# Patient Record
Sex: Female | Born: 1970 | Race: White | Hispanic: No | Marital: Single | Smoking: Never smoker
Health system: Southern US, Community
[De-identification: ages and names within clinical notes are randomized; demographics above are authoritative.]

## PROBLEM LIST (undated history)

## (undated) DIAGNOSIS — D649 Anemia, unspecified: Secondary | ICD-10-CM

## (undated) DIAGNOSIS — K219 Gastro-esophageal reflux disease without esophagitis: Secondary | ICD-10-CM

## (undated) DIAGNOSIS — E7800 Pure hypercholesterolemia, unspecified: Secondary | ICD-10-CM

## (undated) DIAGNOSIS — K589 Irritable bowel syndrome without diarrhea: Secondary | ICD-10-CM

## (undated) DIAGNOSIS — R7303 Prediabetes: Secondary | ICD-10-CM

## (undated) DIAGNOSIS — Z862 Personal history of diseases of the blood and blood-forming organs and certain disorders involving the immune mechanism: Secondary | ICD-10-CM

## (undated) HISTORY — DX: Personal history of diseases of the blood and blood-forming organs and certain disorders involving the immune mechanism: Z86.2

## (undated) HISTORY — DX: Prediabetes: R73.03

## (undated) HISTORY — DX: Anemia, unspecified: D64.9

## (undated) HISTORY — DX: Irritable bowel syndrome, unspecified: K58.9

## (undated) HISTORY — DX: Pure hypercholesterolemia, unspecified: E78.00

## (undated) HISTORY — DX: Gastro-esophageal reflux disease without esophagitis: K21.9

---

## 2001-09-25 ENCOUNTER — Encounter: Payer: Self-pay | Admitting: Obstetrics and Gynecology

## 2001-09-25 ENCOUNTER — Encounter: Admission: RE | Admit: 2001-09-25 | Discharge: 2001-09-25 | Payer: Self-pay | Admitting: Obstetrics and Gynecology

## 2002-05-29 ENCOUNTER — Ambulatory Visit (HOSPITAL_COMMUNITY): Admission: RE | Admit: 2002-05-29 | Discharge: 2002-05-29 | Payer: Self-pay | Admitting: Gastroenterology

## 2004-01-22 ENCOUNTER — Other Ambulatory Visit: Admission: RE | Admit: 2004-01-22 | Discharge: 2004-01-22 | Payer: Self-pay | Admitting: *Deleted

## 2005-02-25 ENCOUNTER — Other Ambulatory Visit: Admission: RE | Admit: 2005-02-25 | Discharge: 2005-02-25 | Payer: Self-pay | Admitting: *Deleted

## 2006-02-27 ENCOUNTER — Other Ambulatory Visit: Admission: RE | Admit: 2006-02-27 | Discharge: 2006-02-27 | Payer: Self-pay | Admitting: *Deleted

## 2007-03-06 ENCOUNTER — Other Ambulatory Visit: Admission: RE | Admit: 2007-03-06 | Discharge: 2007-03-06 | Payer: Self-pay | Admitting: *Deleted

## 2008-03-06 ENCOUNTER — Other Ambulatory Visit: Admission: RE | Admit: 2008-03-06 | Discharge: 2008-03-06 | Payer: Self-pay | Admitting: Family Medicine

## 2009-03-12 ENCOUNTER — Other Ambulatory Visit: Admission: RE | Admit: 2009-03-12 | Discharge: 2009-03-12 | Payer: Self-pay | Admitting: Family Medicine

## 2009-03-19 ENCOUNTER — Encounter: Admission: RE | Admit: 2009-03-19 | Discharge: 2009-03-19 | Payer: Self-pay | Admitting: Family Medicine

## 2010-02-27 ENCOUNTER — Other Ambulatory Visit: Payer: Self-pay | Admitting: Family Medicine

## 2010-02-27 DIAGNOSIS — Z1239 Encounter for other screening for malignant neoplasm of breast: Secondary | ICD-10-CM

## 2010-03-22 ENCOUNTER — Ambulatory Visit
Admission: RE | Admit: 2010-03-22 | Discharge: 2010-03-22 | Disposition: A | Discharge status: Home / Self Care | Payer: BC Managed Care – PPO | Source: Ambulatory Visit | Attending: Family Medicine | Admitting: Family Medicine

## 2010-03-22 DIAGNOSIS — Z1239 Encounter for other screening for malignant neoplasm of breast: Secondary | ICD-10-CM

## 2010-06-25 NOTE — Op Note (Signed)
   NAMEMIKELE, Erica Newton                     ACCOUNT NO.:  [id number]   MEDICAL RECORD NO.:  [id number]                   PATIENT TYPE:  AMB   LOCATION:  ENDO                                 FACILITY:  [facility]   PHYSICIAN:  Danise Edge, M.D.                DATE OF BIRTH:  08-13-1970   DATE OF PROCEDURE:  05/29/2002  DATE OF DISCHARGE:                                 OPERATIVE REPORT   REFERRING PHYSICIAN:  Sigmund Hazel, M.D.   PROCEDURE:  Diagnostic colonoscopy.   PROCEDURE INDICATION:  Ms. Sharra Cayabyab is a 40 year old female, born  06-27-1970.  Ms Lucy has had 2-3 episodes of painless hematochezia.   ENDOSCOPIST:  Danise Edge, M.D.   PREMEDICATION:  1. Versed 7 mg.  2. Demerol 70 mg.   DESCRIPTION OF PROCEDURE:  After obtaining informed consent, Ms. Aki  was placed in the left lateral decubitus position.  I administered  intravenous Demerol and intravenous Versed to achieve conscious sedation for  the procedure.  The patient's blood pressure, oxygen saturation, and cardiac  rhythm were monitored throughout the procedure and documented in the medical  record.   Anal inspection was normal.  I do not detect anal fissures or external  hemorrhoids.  Digital rectal exam was normal.  The Olympus pediatric video  colonoscope was introduced into the rectum and easily advanced to the cecum.  A normal-appearing ileocecal valve was intubated and the distal ileum  inspected.  Colonic preparation for the exam today was excellent.   RECTUM:  Normal.  SIGMOID COLON AND DESCENDING COLON:  Normal.  SPLENIC FLEXURE:  Normal.  TRANSVERSE COLON:  Normal.  HEPATIC FLEXURE:  Normal.  ASCENDING COLON:  Normal.  CECUM AND ILEOCECAL VALVE:  Normal.  DISTAL ILEUM:  Normal.   ASSESSMENT:  Normal proctocolonoscopy to the cecum with distal ileum  inspection.  No signs for the presence of inflammatory bowel disease,  gastrointestinal bleeding, or colorectal  neoplasia.    RECOMMENDATIONS:  I suspect Ms. Setters had bleeding from internal  hemorrhoids which have spontaneously stopped and requires further therapy or  evaluation.                                               Danise Edge, M.D.    MJ/MEDQ  D:  05/29/2002  T:  05/29/2002  Job:  [id number]   cc:   Sigmund Hazel, M.D.  [address]  [address] [place], [place] [address]  Fax: [phone]

## 2011-03-14 ENCOUNTER — Other Ambulatory Visit: Payer: Self-pay | Admitting: Obstetrics & Gynecology

## 2011-03-14 DIAGNOSIS — Z1231 Encounter for screening mammogram for malignant neoplasm of breast: Secondary | ICD-10-CM

## 2011-05-23 ENCOUNTER — Ambulatory Visit
Admission: RE | Admit: 2011-05-23 | Discharge: 2011-05-23 | Disposition: A | Discharge status: Home / Self Care | Payer: BC Managed Care – PPO | Source: Ambulatory Visit | Attending: Obstetrics & Gynecology | Admitting: Obstetrics & Gynecology

## 2011-05-23 DIAGNOSIS — Z1231 Encounter for screening mammogram for malignant neoplasm of breast: Secondary | ICD-10-CM

## 2012-04-24 ENCOUNTER — Other Ambulatory Visit: Payer: Self-pay

## 2012-04-24 DIAGNOSIS — Z1231 Encounter for screening mammogram for malignant neoplasm of breast: Secondary | ICD-10-CM

## 2012-05-23 ENCOUNTER — Ambulatory Visit
Admission: RE | Admit: 2012-05-23 | Discharge: 2012-05-23 | Disposition: A | Discharge status: Home / Self Care | Payer: BC Managed Care – PPO | Source: Ambulatory Visit

## 2012-05-23 DIAGNOSIS — Z1231 Encounter for screening mammogram for malignant neoplasm of breast: Secondary | ICD-10-CM

## 2012-06-05 ENCOUNTER — Encounter: Payer: Self-pay | Admitting: Obstetrics & Gynecology

## 2012-06-07 ENCOUNTER — Encounter: Payer: Self-pay | Admitting: Obstetrics & Gynecology

## 2012-06-07 ENCOUNTER — Ambulatory Visit (INDEPENDENT_AMBULATORY_CARE_PROVIDER_SITE_OTHER): Payer: BC Managed Care – PPO | Admitting: Obstetrics & Gynecology

## 2012-06-07 VITALS — BP 116/70 | Ht 63.75 in | Wt 157.6 lb

## 2012-06-07 DIAGNOSIS — Z01419 Encounter for gynecological examination (general) (routine) without abnormal findings: Secondary | ICD-10-CM

## 2012-06-07 DIAGNOSIS — Z0000 Encounter for general adult medical examination without abnormal findings: Secondary | ICD-10-CM

## 2012-06-07 LAB — CBC
Hemoglobin: 11.8 g/dL — ABNORMAL LOW (ref 12.0–15.0)
MCH: 30.3 pg (ref 26.0–34.0)
MCHC: 33 g/dL (ref 30.0–36.0)
MCV: 91.8 fL (ref 78.0–100.0)
Platelets: 368 10*3/uL (ref 150–400)

## 2012-06-07 LAB — POCT URINALYSIS DIPSTICK
Protein, UA: NEGATIVE
Urobilinogen, UA: NEGATIVE
pH, UA: 5

## 2012-06-07 LAB — COMPREHENSIVE METABOLIC PANEL
CO2: 29 mEq/L (ref 19–32)
Calcium: 9.4 mg/dL (ref 8.4–10.5)
Creat: 0.7 mg/dL (ref 0.50–1.10)
Glucose, Bld: 86 mg/dL (ref 70–99)
Total Bilirubin: 0.3 mg/dL (ref 0.3–1.2)

## 2012-06-07 LAB — LIPID PANEL
Cholesterol: 202 mg/dL — ABNORMAL HIGH (ref 0–200)
Total CHOL/HDL Ratio: 4.7 Ratio
Triglycerides: 277 mg/dL — ABNORMAL HIGH (ref <150)
VLDL: 55 mg/dL — ABNORMAL HIGH (ref 0–40)

## 2012-06-07 MED ORDER — NORGESTIM-ETH ESTRAD TRIPHASIC 0.18/0.215/0.25 MG-35 MCG PO TABS: 1.0000 | ORAL_TABLET | Freq: Every day | ORAL | Status: DC

## 2012-06-07 NOTE — Patient Instructions (Signed)

## 2012-06-07 NOTE — Progress Notes (Signed)
42 y.o. G0P0000 SingleCaucasianF here for annual exam.  Cycles regular and lasting 4-5 days.  Not heavy.  Would like blood work today.  Patient's last menstrual period was 05/16/2012.          Sexually active: no  The current method of family planning is OCP (estrogen/progesterone).    Exercising: yes  walking and exercise bike Smoker:  no  Health Maintenance: Pap:  05/18/11 WNL/negative HR HPV MMG:  04/24/12 normal.  Grade 3 density finding. Colonoscopy:  2003 normal due to rectal bleeding. BMD:   none TDaP:  Up to date per patient.  Did at [facility]. Labs: done last year   reports that she has never smoked. She has never used smokeless tobacco. She reports that she does not drink alcohol or use illicit drugs.  Past Medical History  Diagnosis Date  . Patient born of twin pregnancy   . IBS (irritable bowel syndrome)   . GERD (gastroesophageal reflux disease)     History reviewed. No pertinent past surgical history.  Current Outpatient Prescriptions  Medication Sig Dispense Refill  . Hyoscyamine Sulfate (LEVSIN PO) Take by mouth as needed.      Lorita Officer Triphasic (TRI-SPRINTEC PO) Take by mouth daily.      . lansoprazole (PREVACID) 30 MG capsule 30 mg. Every other day       No current facility-administered medications for this visit.    Family History  Problem Relation Age of Onset  . Diabetes Paternal Grandmother   . Hypertension Father   . Diabetes Father 80    ROS:  Pertinent items are noted in HPI.  Otherwise, a comprehensive ROS was negative.  Exam:   BP 116/70  Ht 5' 3.75" (1.619 m)  Wt 157 lb 9.6 oz (71.487 kg)  BMI 27.27 kg/m2  LMP 05/16/2012   Height: 5' 3.75" (161.9 cm)  Ht Readings from Last 3 Encounters:  06/07/12 5' 3.75" (1.619 m)    General appearance: alert, cooperative and appears stated age Head: Normocephalic, without obvious abnormality, atraumatic Neck: no adenopathy, supple, symmetrical, trachea midline and thyroid normal to  inspection and palpation Lungs: clear to auscultation bilaterally Breasts: normal appearance, no masses or tenderness Heart: regular rate and rhythm Abdomen: soft, non-tender; bowel sounds normal; no masses,  no organomegaly Extremities: extremities normal, atraumatic, no cyanosis or edema Skin: Skin color, texture, turgor normal. No rashes or lesions Lymph nodes: Cervical, supraclavicular, and axillary nodes normal. No abnormal inguinal nodes palpated Neurologic: Grossly normal   Pelvic: External genitalia:  no lesions              Urethra:  normal appearing urethra with no masses, tenderness or lesions              Bartholins and Skenes: normal                 Vagina: normal appearing vagina with normal color and discharge, no lesions              Cervix: no lesions              Pap taken: no Bimanual Exam:  Uterus:  normal size, contour, position, consistency, mobility, non-tender              Adnexa: no mass, fullness, tenderness               Rectovaginal: Confirms               Anus:  normal sphincter tone, no  lesions  A:  Well Woman with normal exam No SA On OCPs for BC  P:   Mammogram d/w pt.  3D recommended due to Grade 3 dense breasts CBC, CMP, Lipid Pap smear neg with neg HR HPV 4/13.  No pap today. Rx for Trisprintec done [facility]. return annually or prn  An After Visit Summary was printed and given to the patient.

## 2012-06-18 ENCOUNTER — Telehealth: Payer: Self-pay | Admitting: Obstetrics & Gynecology

## 2012-06-18 NOTE — Telephone Encounter (Signed)
LEFT MESSAGE OF NEED TO CALL OUR OFFICE FOR LAB RESULTS PER DR. [name]. ASK OF KELLY OR SUE.

## 2012-06-18 NOTE — Telephone Encounter (Signed)
Patient stated she was returning [facility] call but no call was logged. Call routed to triage per Kennon Rounds.

## 2012-07-25 NOTE — Telephone Encounter (Signed)
Patient notified of all results. 

## 2012-11-26 ENCOUNTER — Ambulatory Visit (INDEPENDENT_AMBULATORY_CARE_PROVIDER_SITE_OTHER): Payer: BC Managed Care – PPO | Admitting: Obstetrics & Gynecology

## 2012-11-26 ENCOUNTER — Encounter: Payer: Self-pay | Admitting: Obstetrics & Gynecology

## 2012-11-26 DIAGNOSIS — R899 Unspecified abnormal finding in specimens from other organs, systems and tissues: Secondary | ICD-10-CM

## 2012-11-26 DIAGNOSIS — R6889 Other general symptoms and signs: Secondary | ICD-10-CM

## 2012-11-26 NOTE — Progress Notes (Signed)
Patient for labs today.

## 2012-11-27 LAB — LIPID PANEL
Cholesterol: 213 mg/dL — ABNORMAL HIGH (ref 0–200)
VLDL: 52 mg/dL — ABNORMAL HIGH (ref 0–40)

## 2012-11-29 ENCOUNTER — Telehealth: Payer: Self-pay

## 2012-11-29 NOTE — Telephone Encounter (Signed)
[email or web address]//kn 

## 2012-11-29 NOTE — Telephone Encounter (Signed)
Message copied by Elisha Headland on Thu Nov 29, 2012  1:50 PM ------      Message from: Jerene Bears      Created: Wed Nov 28, 2012  9:29 AM       Please call.  TGs are really elevated.  Needs to see PCP.  Does she need help with appointment? ------

## 2012-12-04 NOTE — Telephone Encounter (Signed)
Patient notified of all lab results. Copy faxed to PCP.

## 2012-12-06 ENCOUNTER — Telehealth: Payer: Self-pay | Admitting: Obstetrics & Gynecology

## 2012-12-06 NOTE — Telephone Encounter (Signed)
Call to patient, LM that this was done on 10-27 14 (see result note) but repeating now, LMTCB if additional info needed.  Call to Dr Wynonia Lawman office, confirm fax [phone], and send to Dr Wynonia Lawman nurses's attention.Labs refaxed.

## 2012-12-06 NOTE — Telephone Encounter (Signed)
No return call from patient.  Routed to provider for review. Encounter closed.

## 2012-12-06 NOTE — Telephone Encounter (Signed)
Patient is calling saying we were supposed to fax results from blood test last week. They told her we would send it to her regular doctor. Which is Sigmund Hazel at [place] the phone number is [phone]

## 2013-06-21 ENCOUNTER — Other Ambulatory Visit: Payer: Self-pay

## 2013-06-21 DIAGNOSIS — Z1231 Encounter for screening mammogram for malignant neoplasm of breast: Secondary | ICD-10-CM

## 2013-06-24 ENCOUNTER — Encounter (INDEPENDENT_AMBULATORY_CARE_PROVIDER_SITE_OTHER): Payer: Self-pay

## 2013-06-24 ENCOUNTER — Ambulatory Visit
Admission: RE | Admit: 2013-06-24 | Discharge: 2013-06-24 | Disposition: A | Discharge status: Home / Self Care | Payer: BC Managed Care – PPO | Source: Ambulatory Visit

## 2013-06-24 DIAGNOSIS — Z1231 Encounter for screening mammogram for malignant neoplasm of breast: Secondary | ICD-10-CM

## 2013-07-26 ENCOUNTER — Inpatient Hospital Stay (HOSPITAL_COMMUNITY)
Admission: EM | Admit: 2013-07-26 | Discharge: 2013-07-28 | DRG: 645 | Disposition: A | Discharge status: Home / Self Care | Payer: BC Managed Care – PPO | Attending: Family Medicine | Admitting: Family Medicine

## 2013-07-26 ENCOUNTER — Encounter (HOSPITAL_COMMUNITY): Payer: Self-pay | Admitting: Emergency Medicine

## 2013-07-26 DIAGNOSIS — E871 Hypo-osmolality and hyponatremia: Secondary | ICD-10-CM | POA: Diagnosis present

## 2013-07-26 DIAGNOSIS — M7150 Other bursitis, not elsewhere classified, unspecified site: Secondary | ICD-10-CM | POA: Diagnosis present

## 2013-07-26 DIAGNOSIS — F329 Major depressive disorder, single episode, unspecified: Secondary | ICD-10-CM | POA: Diagnosis present

## 2013-07-26 DIAGNOSIS — K219 Gastro-esophageal reflux disease without esophagitis: Secondary | ICD-10-CM | POA: Diagnosis present

## 2013-07-26 DIAGNOSIS — Z833 Family history of diabetes mellitus: Secondary | ICD-10-CM

## 2013-07-26 DIAGNOSIS — E236 Other disorders of pituitary gland: Principal | ICD-10-CM | POA: Diagnosis present

## 2013-07-26 DIAGNOSIS — Z79899 Other long term (current) drug therapy: Secondary | ICD-10-CM

## 2013-07-26 DIAGNOSIS — Z8249 Family history of ischemic heart disease and other diseases of the circulatory system: Secondary | ICD-10-CM

## 2013-07-26 DIAGNOSIS — F3289 Other specified depressive episodes: Secondary | ICD-10-CM | POA: Diagnosis present

## 2013-07-26 DIAGNOSIS — T50995A Adverse effect of other drugs, medicaments and biological substances, initial encounter: Secondary | ICD-10-CM | POA: Diagnosis present

## 2013-07-26 LAB — CBC WITH DIFFERENTIAL/PLATELET
BASOS ABS: 0 10*3/uL (ref 0.0–0.1)
BASOS PCT: 0 % (ref 0–1)
EOS PCT: 1 % (ref 0–5)
Eosinophils Absolute: 0.1 10*3/uL (ref 0.0–0.7)
HEMATOCRIT: 35.1 % — AB (ref 36.0–46.0)
Hemoglobin: 12 g/dL (ref 12.0–15.0)
Lymphocytes Relative: 28 % (ref 12–46)
Lymphs Abs: 2.9 10*3/uL (ref 0.7–4.0)
MCH: 30.4 pg (ref 26.0–34.0)
MCHC: 34.2 g/dL (ref 30.0–36.0)
MCV: 88.9 fL (ref 78.0–100.0)
MONO ABS: 0.6 10*3/uL (ref 0.1–1.0)
Monocytes Relative: 6 % (ref 3–12)
Neutro Abs: 6.8 10*3/uL (ref 1.7–7.7)
Neutrophils Relative %: 65 % (ref 43–77)
PLATELETS: 303 10*3/uL (ref 150–400)
RBC: 3.95 MIL/uL (ref 3.87–5.11)
RDW: 12.5 % (ref 11.5–15.5)
WBC: 10.3 10*3/uL (ref 4.0–10.5)

## 2013-07-26 LAB — BASIC METABOLIC PANEL
BUN: 15 mg/dL (ref 6–23)
CALCIUM: 9.2 mg/dL (ref 8.4–10.5)
CO2: 25 mEq/L (ref 19–32)
CREATININE: 0.56 mg/dL (ref 0.50–1.10)
Chloride: 89 mEq/L — ABNORMAL LOW (ref 96–112)
Glucose, Bld: 118 mg/dL — ABNORMAL HIGH (ref 70–99)
Potassium: 4.1 mEq/L (ref 3.7–5.3)
Sodium: 126 mEq/L — ABNORMAL LOW (ref 137–147)

## 2013-07-26 LAB — URINALYSIS, ROUTINE W REFLEX MICROSCOPIC
Bilirubin Urine: NEGATIVE
Glucose, UA: NEGATIVE mg/dL
KETONES UR: 40 mg/dL — AB
LEUKOCYTES UA: NEGATIVE
NITRITE: NEGATIVE
PROTEIN: NEGATIVE mg/dL
Specific Gravity, Urine: 1.026 (ref 1.005–1.030)
UROBILINOGEN UA: 0.2 mg/dL (ref 0.0–1.0)
pH: 6 (ref 5.0–8.0)

## 2013-07-26 LAB — URINE MICROSCOPIC-ADD ON

## 2013-07-26 MED ORDER — FERROUS SULFATE 325 (65 FE) MG PO TABS
325.0000 mg | ORAL_TABLET | Freq: Every day | ORAL | Status: DC
  Administered 2013-07-27 – 2013-07-28 (×2): 325 mg via ORAL
  Filled 2013-07-26 (×3): qty 1

## 2013-07-26 MED ORDER — SODIUM CHLORIDE 0.9 % IV BOLUS (SEPSIS)
1000.0000 mL | Freq: Once | INTRAVENOUS | Status: AC
  Administered 2013-07-26: 1000 mL via INTRAVENOUS

## 2013-07-26 MED ORDER — ESCITALOPRAM OXALATE 10 MG PO TABS
10.0000 mg | ORAL_TABLET | Freq: Every day | ORAL | Status: DC
  Administered 2013-07-27: 10 mg via ORAL
  Filled 2013-07-26 (×2): qty 1

## 2013-07-26 MED ORDER — HEPARIN SODIUM (PORCINE) 5000 UNIT/ML IJ SOLN
5000.0000 [IU] | Freq: Three times a day (TID) | INTRAMUSCULAR | Status: DC
Start: 2013-07-26 — End: 2013-07-28
  Administered 2013-07-26 – 2013-07-28 (×5): 5000 [IU] via SUBCUTANEOUS
  Filled 2013-07-26 (×8): qty 1

## 2013-07-26 MED ORDER — PANTOPRAZOLE SODIUM 20 MG PO TBEC
20.0000 mg | DELAYED_RELEASE_TABLET | Freq: Every day | ORAL | Status: DC
  Administered 2013-07-27 – 2013-07-28 (×2): 20 mg via ORAL
  Filled 2013-07-26 (×2): qty 1

## 2013-07-26 MED ORDER — NORGESTIM-ETH ESTRAD TRIPHASIC 0.18/0.215/0.25 MG-35 MCG PO TABS: 1.0000 | ORAL_TABLET | Freq: Every day | ORAL | Status: DC

## 2013-07-26 MED ORDER — HYOSCYAMINE SULFATE 0.125 MG PO TABS
0.1250 mg | ORAL_TABLET | ORAL | Status: DC | PRN
  Filled 2013-07-26: qty 1

## 2013-07-26 MED ORDER — SODIUM CHLORIDE 0.9 % IV SOLN
INTRAVENOUS | Status: DC
  Administered 2013-07-26: 23:00:00 via INTRAVENOUS

## 2013-07-26 NOTE — H&P (Signed)
[facility] History and Physical  Erica CaperDeborah R Newton ZOX:[id number] DOB: [birth year] DOA: 07/26/2013  Referring physician: Renae FickleEd PCP: Erica Newton,Erica LYNN, MD  Specialists: none  Chief Complaint: symptomatic hyponatremia  HPI:  43 year old Caucasian female history of depression, GERD, irritable bowel went to the PCPs office today and stated that she felt dizzy. She has been lightheaded for about a week. In the past she's been told that she has low blood count and therefore requested labs to be done. Random sodium came back as 126.   This was confirmed by emergency room workup  in the past patient's sodium has 135 as about one year ago.  urine analysis showed specific gravity 1.026 [cose to 1.030 upper limit] as well as 40 ketones, many scones epithelial and bacteria She's not on any antihypertensives or any diuretics.  She does not take any over-the-counter medications.   Review of Systems: The patient denies   Fever  chills  nausea  vomiting  dysuria  diarrhea-- had 2 episodes about 2 days ago and this resolved and she has not had any further since  nausea  vomiting  chest pain  Past Medical History  Diagnosis Date  . Patient born of twin pregnancy   . IBS (irritable bowel syndrome)   . GERD (gastroesophageal reflux disease)    History reviewed. No pertinent past surgical history. Social History:  History   Social History Narrative  . No narrative on file    No Known Allergies  Family History  Problem Relation Age of Onset  . Diabetes Paternal Grandmother   . Hypertension Father   . Diabetes Father 8180   Prior to Admission medications   Medication Sig Start Date End Date Taking? Authorizing Provider  acetaminophen (TYLENOL) 500 MG tablet Take 500 mg by mouth every 6 (six) hours as needed for moderate pain.   Yes Historical Provider, MD  aluminum chloride (DRYSOL) 20 % external solution Apply 1 application topically at bedtime.   Yes Historical Provider, MD   escitalopram (LEXAPRO) 10 MG tablet Take 10 mg by mouth daily.   Yes Historical Provider, MD  ferrous sulfate 325 (65 FE) MG tablet Take 325 mg by mouth daily with breakfast.   Yes Historical Provider, MD  hyoscyamine (LEVSIN, ANASPAZ) 0.125 MG tablet Take 0.125 mg by mouth every 4 (four) hours as needed for cramping.   Yes Historical Provider, MD  lansoprazole (PREVACID) 30 MG capsule Take 30 mg by mouth every other day.  05/03/12  Yes Historical Provider, MD  Multiple Vitamin (MULTIVITAMIN WITH MINERALS) TABS tablet Take 1 tablet by mouth daily.   Yes Historical Provider, MD  Norgestimate-Ethinyl Estradiol Triphasic 0.18/0.215/0.25 MG-35 MCG tablet Take 1 tablet by mouth daily. 06/07/12  Yes Erica BootsMary Suzanne [name], MD   Physical Exam: Filed Vitals:   07/26/13 1820 07/26/13 1939 07/26/13 2051  BP: 118/63 111/64 112/78  Pulse: 69 65 76  Temp: 98.8 F (37.1 C)    TempSrc: Oral    Resp: 16 17 18   SpO2: 96% 94% 99%     General:   Her pleasant oriented  Eyes:  EOMI NCAT  ENT:   Good dentition  Neck: soft supple  Cardiovascular:  S1-S2 no murmur rub or gallop  Respiratory:  Clinically clear no added sound  Abdomen:  Soft nontender nondistended no rebound or guarding  Skin:  No lower extremity edema no swelling  Musculoskeletal:  Visual contact  Psychiatric:  Pleasant euthymic  Neurologic:  Power 5/5 reflexes 2/3  Labs on Admission:  Basic  Metabolic Panel:  Recent Labs Lab 07/26/13 1912  NA 126*  K 4.1  CL 89*  CO2 25  GLUCOSE 118*  BUN 15  CREATININE 0.56  CALCIUM 9.2   Liver Function Tests: No results found for this basename: AST, ALT, ALKPHOS, BILITOT, PROT, ALBUMIN,  in the last 168 hours No results found for this basename: LIPASE, AMYLASE,  in the last 168 hours No results found for this basename: AMMONIA,  in the last 168 hours CBC:  Recent Labs Lab 07/26/13 1912  WBC 10.3  NEUTROABS 6.8  HGB 12.0  HCT 35.1*  MCV 88.9  PLT 303   Cardiac  Enzymes: No results found for this basename: CKTOTAL, CKMB, CKMBINDEX, TROPONINI,  in the last 168 hours  BNP (last 3 results) No results found for this basename: PROBNP,  in the last 8760 hours CBG: No results found for this basename: GLUCAP,  in the last 168 hours  Radiological Exams on Admission: No results found.  EKG: Independently reviewed.  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  she also denies stating that she drinks excessively  if her urine osmolality is less than 100 this could be secondary to primary polydipsia or decreased solid intake  if urine osmolality is increased then this would point to glucocorticoid insufficiency hypothyroidism. We have ordered urine osmolality TSH and cortisol and random  Urinary sodium  in the interim we will continue saline at around 100 cc per hour. If her sodium comes close to 130 she could possibly be discharged feels better in the morning pending further workup   bursitis-physical therapy referral placed by primary care physician Depression-I have recommended to patient that we may need to consider alternate SSRI or tapering off of this she understands Birth control- continue estradiol  Time spent:  45 minutes  Erica Newton, [facility] Hospital[facility]MUKH [facility] Pager [phone]  If 7PM-7AM, please contact night-coverage [email or web address] Password [email or web address] 07/26/2013, 9:20 PM

## 2013-07-26 NOTE — ED Notes (Signed)
[facility], was told to come to ED due to low sodium levels. Had went to PCP due to feeling weak.

## 2013-07-26 NOTE — ED Notes (Signed)
Pt from PCP with abd lab, low Na+. Pt states that she went to her PCP c/o dizziness and had had one instance of diarrhea 3 days ago. Pt denies N/V, fever, CP, SOB. Pt states that she has a mild HA, but not enough to rate on pain scale. Pt is A&O and in NAD

## 2013-07-26 NOTE — ED Provider Notes (Signed)
CSN: [id number]     Arrival date & time 07/26/13  1811 History   First MD Initiated Contact with Patient 07/26/13 1833     Chief Complaint  Patient presents with  . Abnormal Lab     (Consider location/radiation/quality/duration/timing/severity/associated sxs/prior Treatment) HPI Comments: Patient presents today with Hyponatremia.  She reports that she saw her PCP yesterday because she was feeling generalized weakness and lightheaded for the past week.  Her PCP ran labs at that time.  She was called by her PCP today and told to come to the ED because her Sodium was low.  Patient is unsure how low the Sodium is.  She reports two episodes of loose stool two days ago, but none since that time.  She denies vomiting.  Denies abdominal pain.  She denies drinking excessive amounts of water.  She denies any recent changes in medication.  She is on Lexapro, but reports that she has been on this medication for ten months.  She denies any urinary symptoms.    The history is provided by the patient.    Past Medical History  Diagnosis Date  . Patient born of twin pregnancy   . IBS (irritable bowel syndrome)   . GERD (gastroesophageal reflux disease)    History reviewed. No pertinent past surgical history. Family History  Problem Relation Age of Onset  . Diabetes Paternal Grandmother   . Hypertension Father   . Diabetes Father 5580   History  Substance Use Topics  . Smoking status: Never Smoker   . Smokeless tobacco: Never Used  . Alcohol Use: No   OB History   Grav Para Term Preterm Abortions TAB SAB Ect Mult Living   0 0 0 0 0 0 0 0 0 0      Review of Systems  All other systems reviewed and are negative.     Allergies  Review of patient's allergies indicates no known allergies.  Home Medications   Prior to Admission medications   Medication Sig Start Date End Date Taking? Authorizing Provider  acetaminophen (TYLENOL) 500 MG tablet Take 500 mg by mouth every 6 (six) hours as needed  for moderate pain.   Yes Historical Provider, MD  aluminum chloride (DRYSOL) 20 % external solution Apply 1 application topically at bedtime.   Yes Historical Provider, MD  escitalopram (LEXAPRO) 10 MG tablet Take 10 mg by mouth daily.   Yes Historical Provider, MD  ferrous sulfate 325 (65 FE) MG tablet Take 325 mg by mouth daily with breakfast.   Yes Historical Provider, MD  hyoscyamine (LEVSIN, ANASPAZ) 0.125 MG tablet Take 0.125 mg by mouth every 4 (four) hours as needed for cramping.   Yes Historical Provider, MD  lansoprazole (PREVACID) 30 MG capsule Take 30 mg by mouth every other day.  05/03/12  Yes Historical Provider, MD  Multiple Vitamin (MULTIVITAMIN WITH MINERALS) TABS tablet Take 1 tablet by mouth daily.   Yes Historical Provider, MD  Norgestimate-Ethinyl Estradiol Triphasic 0.18/0.215/0.25 MG-35 MCG tablet Take 1 tablet by mouth daily. 06/07/12  Yes [name], MD   BP 118/63  Pulse 69  Temp(Src) 98.8 F (37.1 C) (Oral)  Resp 16  SpO2 96%  LMP 07/08/2013 Physical Exam  Nursing note and vitals reviewed. Constitutional: She appears well-developed and well-nourished. No distress.  HENT:  Head: Normocephalic and atraumatic.  Mouth/Throat: Oropharynx is clear and moist.  Eyes: EOM are normal. Pupils are equal, round, and reactive to light.  Neck: Normal range of motion. Neck supple.  Cardiovascular: Normal rate, regular rhythm and normal heart sounds.   Pulmonary/Chest: Effort normal and breath sounds normal.  Abdominal: Soft. Bowel sounds are normal. There is no tenderness.  Musculoskeletal: Normal range of motion.  Neurological: She is alert. She has normal strength. No cranial nerve deficit or sensory deficit. Gait normal.  Skin: Skin is warm and dry. She is not diaphoretic.  Psychiatric: She has a normal mood and affect.    ED Course  Procedures (including critical care time) Labs Review Labs Reviewed - No data to display  Imaging Review No results found.    EKG Interpretation None      MDM   Final diagnoses:  None   Patient presents today with a chief complaint of Hyponatremia.  She was seen by her PCP for lightheadedness and was found to have a low Sodium.  Sodium is 126 in the ED.  Patient denies any recent changes in medication.  No excessive drinking.  No vomiting.  Unsure of the cause of the Hyponatremia.  On exam patient is well appearing.  Patient admitted to [facility] for further work up and management of Hyponatremia.      Santiago GladHeather Azel Gumina, PA-C 07/27/13 [id number]

## 2013-07-27 DIAGNOSIS — E871 Hypo-osmolality and hyponatremia: Secondary | ICD-10-CM

## 2013-07-27 LAB — BASIC METABOLIC PANEL
BUN: 10 mg/dL (ref 6–23)
CALCIUM: 8.3 mg/dL — AB (ref 8.4–10.5)
CO2: 22 mEq/L (ref 19–32)
CREATININE: 0.6 mg/dL (ref 0.50–1.10)
Chloride: 91 mEq/L — ABNORMAL LOW (ref 96–112)
GFR calc Af Amer: >90 mL/min (ref >90)
GFR calc non Af Amer: >90 mL/min (ref >90)
Glucose, Bld: 101 mg/dL — ABNORMAL HIGH (ref 70–99)
Potassium: 3.9 mEq/L (ref 3.7–5.3)
Sodium: 125 mEq/L — ABNORMAL LOW (ref 137–147)

## 2013-07-27 LAB — CBC
HCT: 32 % — ABNORMAL LOW (ref 36.0–46.0)
Hemoglobin: 10.9 g/dL — ABNORMAL LOW (ref 12.0–15.0)
MCH: 30.7 pg (ref 26.0–34.0)
MCHC: 34.1 g/dL (ref 30.0–36.0)
MCV: 90.1 fL (ref 78.0–100.0)
PLATELETS: 254 10*3/uL (ref 150–400)
RBC: 3.55 MIL/uL — ABNORMAL LOW (ref 3.87–5.11)
RDW: 12.5 % (ref 11.5–15.5)
WBC: 8.6 10*3/uL (ref 4.0–10.5)

## 2013-07-27 LAB — OSMOLALITY, URINE: Osmolality, Ur: 870 mOsm/kg (ref 390–1090)

## 2013-07-27 LAB — PROTIME-INR
INR: 0.99 (ref 0.00–1.49)
Prothrombin Time: 12.9 seconds (ref 11.6–15.2)

## 2013-07-27 LAB — CORTISOL
CORTISOL PLASMA: 21.9 ug/dL
CORTISOL PLASMA: 19.1 ug/dL

## 2013-07-27 LAB — SODIUM, URINE, RANDOM: SODIUM UR: 189 meq/L

## 2013-07-27 LAB — TSH: TSH: 6.41 u[IU]/mL — ABNORMAL HIGH (ref 0.350–4.500)

## 2013-07-27 LAB — OSMOLALITY: OSMOLALITY: 256 mosm/kg — AB (ref 275–300)

## 2013-07-27 NOTE — Consult Note (Signed)
[facility] Service Consult Note [facility]  Erica CaperDeborah R Newton 07/27/2013 Erica KrabbeSCHERTZ,Erica Newton Requesting Physician: Dr. Sharl MaLama  Reason for Consult:  Hyponatremia HPI: The patient is a 43 y.o. year-old with hx of IBS, GERD has been taking an SSRI since last August for depression. She had some labwork done and Na was low so she was admitted for workup.  Cortisol is wnl and TSH is slightly elevated. Uosm is inappropriately high , over 800.  UNa is high reflecting normal volume status.  She uses very little OTC meds.  She has no renal history or hx of hyponatremia.  She is generally healthy.   ROS  no CP  no sob  no abd pain, nv/Newton/  no jt pain  no rash  no confusion or seizures  Past Medical History  Past Medical History  Diagnosis Date  . Patient born of twin pregnancy   . IBS (irritable bowel syndrome)   . GERD (gastroesophageal reflux disease)    Past Surgical History History reviewed. No pertinent past surgical history. Family History  Family History  Problem Relation Age of Onset  . Diabetes Paternal Grandmother   . Hypertension Father   . Diabetes Father 3980   Social History  reports that she has never smoked. She has never used smokeless tobacco. She reports that she does not drink alcohol or use illicit drugs. Allergies No Known Allergies Home medications Prior to Admission medications   Medication Sig Start Date End Date Taking? Authorizing Provider  acetaminophen (TYLENOL) 500 MG tablet Take 500 mg by mouth every 6 (six) hours as needed for moderate pain.   Yes Historical Provider, MD  aluminum chloride (DRYSOL) 20 % external solution Apply 1 application topically at bedtime.   Yes Historical Provider, MD  escitalopram (LEXAPRO) 10 MG tablet Take 10 mg by mouth daily.   Yes Historical Provider, MD  ferrous sulfate 325 (65 FE) MG tablet Take 325 mg by mouth daily with breakfast.   Yes Historical Provider, MD  hyoscyamine (LEVSIN, ANASPAZ) 0.125 MG tablet Take  0.125 mg by mouth every 4 (four) hours as needed for cramping.   Yes Historical Provider, MD  lansoprazole (PREVACID) 30 MG capsule Take 30 mg by mouth every other day.  05/03/12  Yes Historical Provider, MD  Multiple Vitamin (MULTIVITAMIN WITH MINERALS) TABS tablet Take 1 tablet by mouth daily.   Yes Historical Provider, MD  Norgestimate-Ethinyl Estradiol Triphasic 0.18/0.215/0.25 MG-35 MCG tablet Take 1 tablet by mouth daily. 06/07/12  Yes [name], MD   Liver Function Tests No results found for this basename: AST, ALT, ALKPHOS, BILITOT, PROT, ALBUMIN,  in the last 168 hours No results found for this basename: LIPASE, AMYLASE,  in the last 168 hours CBC  Recent Labs Lab 07/26/13 1912 07/27/13 0505  WBC 10.3 8.6  NEUTROABS 6.8  --   HGB 12.0 10.9*  HCT 35.1* 32.0*  MCV 88.9 90.1  PLT 303 254   Basic Metabolic Panel  Recent Labs Lab 07/26/13 1912 07/27/13 0505  NA 126* 125*  K 4.1 3.9  CL 89* 91*  CO2 25 22  GLUCOSE 118* 101*  BUN 15 10  CREATININE 0.56 0.60  CALCIUM 9.2 8.3*    Filed Vitals:   07/26/13 2051 07/26/13 2236 07/27/13 0500 07/27/13 1314  BP: 112/78 112/62 101/55 111/71  Pulse: 76 65 57 71  Temp:  98.1 F (36.7 C) 98.5 F (36.9 C) 99.7 F (37.6 C)  TempSrc:  Oral Oral Oral  Resp: 18 16  16 16  Height:  5\' 3"  (1.6 m)    Weight:  73.211 kg (161 lb 6.4 oz)    SpO2: 99% 98%  98%   Exam Alert healthy WF no distress No rash, cyanosis or gangrene Sclera anicteric, throat clear No jvd Chest clear bilat RRR no MRG Abd soft, NTND No LE edema Neuro is nf, Ox 3  Uosm 870 UNa 189 Na 125  Cr 0.60    Hb 10.9   WBC 8.6   plt 254  Assessment: 1 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  There is no evidence clinically or on lab testing of other systemic disease.  Would stop SSRI, fluid restrict for a week or so until Na is up.  If antidepressants resumed in the future, we may want to  try a non-SSRI and check Na levels 2-3 weeks after starting.  OK for discharge, this problem can be managed as an outpatient in this healthy person, with PCP follow-up in a few days for repeat lab testing.  Please call w questions. Will sign off.   Erica Moselleob Schertz MD (pgr) [phone]    [phone] 07/27/2013, 9:07 PM

## 2013-07-27 NOTE — ED Provider Notes (Signed)
Medical screening examination/treatment/procedure(s) were performed by non-physician practitioner and as supervising physician I was immediately available for consultation/collaboration.   EKG Interpretation None        William Blair Walden, MD 07/27/13 1626 

## 2013-07-27 NOTE — Progress Notes (Signed)
[facility] PROGRESS NOTE  Erica CaperDeborah R Newton ZOX:[id number] DOB: 1971/01/16 DOA: 07/26/2013 PCP: Neldon LabellaMILLER,[name], MD  Assessment/Plan: 1. 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 Called and discussed with nephrology, patient has significant elevation of urine sodium 189. Dr. Arlean HoppingSchertz to see the patient. At this time will DC the IV fluids as patient does not seem to be dehydrated. Will also start restriction of 1200 mL per day. 2. Depression- continue Lexapro 3. Birth control- continue estrogen.  Code Status: Full code Family Communication: No family at bedside Disposition Plan: home when stable   Consultants:  Nephrology  Procedures:  None  Antibiotics:  None  HPI/Subjective: 43 year old female with a history of depression, GERD, IBS him to the hospital with symptomatic hyponatremia and found to have sodium of 126. Was started on IV fluids, sodium is 125 this morning.  Objective: Filed Vitals:   07/27/13 0500  BP: 101/55  Pulse: 57  Temp: 98.5 F (36.9 C)  Resp: 16    Intake/Output Summary (Last 24 hours) at 07/27/13 1031 Last data filed at 07/27/13 [id number]  Gross per 24 hour  Intake    240 ml  Output      0 ml  Net    240 ml   Filed Weights   07/26/13 2236  Weight: 73.211 kg (161 lb 6.4 oz)    Exam:   General:  *Appear in no acute distress  Cardiovascular: *s1s2 RRR  Respiratory: *Clear bilaterally  Abdomen: Soft, nontender, no organomegaly  Musculoskeletal: No edema of the lower extremities   Data Reviewed: Basic Metabolic Panel:  Recent Labs Lab 07/26/13 1912 07/27/13 0505  NA 126* 125*  K 4.1 3.9  CL 89* 91*  CO2 25 22  GLUCOSE 118* 101*  BUN 15 10  CREATININE 0.56 0.60  CALCIUM 9.2 8.3*   CBC:  Recent Labs Lab 07/26/13 1912 07/27/13 0505  WBC 10.3 8.6  NEUTROABS 6.8  --   HGB 12.0 10.9*   HCT 35.1* 32.0*  MCV 88.9 90.1  PLT 303 254    Studies: No results found.  Scheduled Meds: . escitalopram  10 mg Oral Daily  . ferrous sulfate  325 mg Oral QPC breakfast  . heparin  5,000 Units Subcutaneous 3 times per day  . Norgestimate-Ethinyl Estradiol Triphasic  1 tablet Oral Daily  . pantoprazole  20 mg Oral Daily   Continuous Infusions:   Active Problems:   Acute hyponatremia    Time spent: 25 min    [facility] PaAMA,GAGAN S  [facility] Pager [phone]. If 7PM-7AM, please contact night-coverage at [email or web address], password [email or web address] 07/27/2013, 10:31 AM  LOS: 1 day

## 2013-07-28 LAB — BASIC METABOLIC PANEL
BUN: 7 mg/dL (ref 6–23)
CHLORIDE: 96 meq/L (ref 96–112)
CO2: 24 mEq/L (ref 19–32)
CREATININE: 0.54 mg/dL (ref 0.50–1.10)
Calcium: 8.9 mg/dL (ref 8.4–10.5)
Glucose, Bld: 98 mg/dL (ref 70–99)
POTASSIUM: 4.2 meq/L (ref 3.7–5.3)
Sodium: 130 mEq/L — ABNORMAL LOW (ref 137–147)

## 2013-07-28 MED ORDER — ESCITALOPRAM OXALATE 10 MG PO TABS: 10.0000 mg | ORAL_TABLET | Freq: Every day | ORAL | Status: DC

## 2013-07-28 NOTE — Progress Notes (Signed)
Pt discharged home in stable condition. Discharge instructions given. Pt verbalized understanding.  

## 2013-07-28 NOTE — Discharge Summary (Signed)
Physician Discharge Summary  Katharina CaperDeborah R Dosanjh ZOX:[id number] DOB: 11/19/1970 DOA: 07/26/2013  PCP: Neldon LabellaMILLER,[name], MD  Admit date: 07/26/2013 Discharge date: 07/28/2013  Time spent: 50* minutes  Recommendations for Outpatient Follow-up:  1. Follow up PCP in 1 week 2.   Discharge Diagnoses:  Active Problems:   Acute hyponatremia   Discharge Condition: Stable  Diet recommendation: low salt diet  Filed Weights   07/26/13 2236  Weight: 73.211 kg (161 lb 6.4 oz)    History of present illness:  43 year old Caucasian female history of depression, GERD, irritable bowel went to the PCPs office today and stated that she felt dizzy. She has been lightheaded for about a week. In the past she's been told that she has low blood count and therefore requested labs to be done.  Random sodium came back as 126. This was confirmed by emergency room workup  in the past patient's sodium has 135 as about one year ago.  urine analysis showed specific gravity 1.026 [cose to 1.030 upper limit] as well as 40 ketones, many scones epithelial and bacteria  She's not on any antihypertensives or any diuretics. She does not take any over-the-counter medications.   Hospital Course:   Hyponatremia- Resolved, likely SIADH, patient has been taking SSRIs Lexapro at home, urine sodium is 189, urine osmolality 870, serum osmolality 256, TSH 6.410, plasma cortisol is 21.9. Called and discussed with nephrology, patient has significant elevation of urine sodium 189. Dr. Arlean HoppingSchertz recommends to stop Lexapro, Sodium has improved to 130 after fluid restriction.   Depression-  Will taper off Lexapro. Will need to call Psychiatrist to start other antidepressant.   Birth control- continue estrogen.   Procedures:  None  Consultations:  Nephrology   Discharge Exam: Filed Vitals:   07/28/13 0625  BP: 92/60  Pulse: 54  Temp: 98.2 F (36.8 C)  Resp: 16    General: Appear in no acute distress Cardiovascular:  S1s2 RRR Respiratory: *Clear bilaterally  Discharge Instructions You were cared for by a hospitalist during your hospital stay. If you have any questions about your discharge medications or the care you received while you were in the hospital after you are discharged, you can call the unit and asked to speak with the hospitalist on call if the hospitalist that took care of you is not available. Once you are discharged, your primary care physician will handle any further medical issues. Please note that NO REFILLS for any discharge medications will be authorized once you are discharged, as it is imperative that you return to your primary care physician (or establish a relationship with a primary care physician if you do not have one) for your aftercare needs so that they can reassess your need for medications and monitor your lab values.  Discharge Instructions   Diet - low sodium heart healthy    Complete by:  As directed      Discharge instructions    Complete by:  As directed   Call your psychiatrist as soon as possible to start other antidepressant and taper off lexapro     Increase activity slowly    Complete by:  As directed             Medication List         acetaminophen 500 MG tablet  Commonly known as:  TYLENOL  Take 500 mg by mouth every 6 (six) hours as needed for moderate pain.     aluminum chloride 20 % external solution  Commonly known as:  DRYSOL  Apply 1 application topically at bedtime.     escitalopram 10 MG tablet  Commonly known as:  LEXAPRO  Take 1 tablet (10 mg total) by mouth daily.     ferrous sulfate 325 (65 FE) MG tablet  Take 325 mg by mouth daily with breakfast.     hyoscyamine 0.125 MG tablet  Commonly known as:  LEVSIN, ANASPAZ  Take 0.125 mg by mouth every 4 (four) hours as needed for cramping.     lansoprazole 30 MG capsule  Commonly known as:  PREVACID  Take 30 mg by mouth every other day.     multivitamin with minerals Tabs tablet  Take  1 tablet by mouth daily.     Norgestimate-Ethinyl Estradiol Triphasic 0.18/0.215/0.25 MG-35 MCG tablet  Take 1 tablet by mouth daily.       No Known Allergies     Follow-up Information   Follow up with Neldon LabellaMILLER,[name], MD In 2 weeks.   Specialty:  Family Medicine   Contact information:   [address] [place] [address] [phone]        The results of significant diagnostics from this hospitalization (including imaging, microbiology, ancillary and laboratory) are listed below for reference.    Significant Diagnostic Studies: No results found.  Microbiology: No results found for this or any previous visit (from the past 240 hour(s)).   Labs: Basic Metabolic Panel:  Recent Labs Lab 07/26/13 1912 07/27/13 0505 07/28/13 0407 07/28/13 0709  NA 126* 125* 129* 130*  K 4.1 3.9 6.1* 4.2  CL 89* 91* 95* 96  CO2 25 22 22 24   GLUCOSE 118* 101* 99 98  BUN 15 10 7 7   CREATININE 0.56 0.60 0.54 0.54  CALCIUM 9.2 8.3* 8.7 8.9   Liver Function Tests: No results found for this basename: AST, ALT, ALKPHOS, BILITOT, PROT, ALBUMIN,  in the last 168 hours No results found for this basename: LIPASE, AMYLASE,  in the last 168 hours No results found for this basename: AMMONIA,  in the last 168 hours CBC:  Recent Labs Lab 07/26/13 1912 07/27/13 0505  WBC 10.3 8.6  NEUTROABS 6.8  --   HGB 12.0 10.9*  HCT 35.1* 32.0*  MCV 88.9 90.1  PLT 303 254   Cardiac Enzymes: No results found for this basename: CKTOTAL, CKMB, CKMBINDEX, TROPONINI,  in the last 168 hours BNP: BNP (last 3 results) No results found for this basename: PROBNP,  in the last 8760 hours CBG: No results found for this basename: GLUCAP,  in the last 168 hours     Signed:  LAMA,GAGAN S  [facility] 07/28/2013, 8:50 AM

## 2013-07-29 LAB — BASIC METABOLIC PANEL
BUN: 7 mg/dL (ref 6–23)
CALCIUM: 8.7 mg/dL (ref 8.4–10.5)
CO2: 22 mEq/L (ref 19–32)
Chloride: 95 mEq/L — ABNORMAL LOW (ref 96–112)
Creatinine, Ser: 0.54 mg/dL (ref 0.50–1.10)
GLUCOSE: 99 mg/dL (ref 70–99)
Potassium: 6.1 mEq/L — ABNORMAL HIGH (ref 3.7–5.3)
SODIUM: 129 meq/L — AB (ref 137–147)

## 2013-08-15 ENCOUNTER — Other Ambulatory Visit: Payer: Self-pay | Admitting: Obstetrics & Gynecology

## 2013-08-15 NOTE — Telephone Encounter (Signed)
Last AEX: 06/07/12 Last refill:06/07/12 # 3 packs, 3 refills Current AEX:09/17/13  Please advise

## 2013-09-12 ENCOUNTER — Ambulatory Visit: Payer: BC Managed Care – PPO | Admitting: Obstetrics & Gynecology

## 2013-09-17 ENCOUNTER — Ambulatory Visit (INDEPENDENT_AMBULATORY_CARE_PROVIDER_SITE_OTHER): Payer: BC Managed Care – PPO | Admitting: Obstetrics & Gynecology

## 2013-09-17 ENCOUNTER — Encounter: Payer: Self-pay | Admitting: Obstetrics & Gynecology

## 2013-09-17 VITALS — BP 102/62 | HR 76 | Resp 16 | Ht 63.75 in | Wt 156.6 lb

## 2013-09-17 DIAGNOSIS — Z01419 Encounter for gynecological examination (general) (routine) without abnormal findings: Secondary | ICD-10-CM

## 2013-09-17 DIAGNOSIS — Z0000 Encounter for general adult medical examination without abnormal findings: Secondary | ICD-10-CM

## 2013-09-17 DIAGNOSIS — Z124 Encounter for screening for malignant neoplasm of cervix: Secondary | ICD-10-CM

## 2013-09-17 LAB — POCT URINALYSIS DIPSTICK
BILIRUBIN UA: NEGATIVE
Blood, UA: NEGATIVE
Glucose, UA: NEGATIVE
Ketones, UA: NEGATIVE
LEUKOCYTES UA: NEGATIVE
NITRITE UA: NEGATIVE
Urobilinogen, UA: NEGATIVE
pH, UA: 5

## 2013-09-17 MED ORDER — NORGESTIM-ETH ESTRAD TRIPHASIC 0.18/0.215/0.25 MG-35 MCG PO TABS: ORAL_TABLET | ORAL | Status: DC

## 2013-09-17 NOTE — Progress Notes (Signed)
[age] y.o. G0P0000 SingleCaucasianF here for annual exam.  Reports had episode of dizziness and fatigue that lasted for a couple of weeks.  Sodium level was low and needed IV therapy for treatment.  The only cause pinpointed was Lexapro.  She has been weaning off this and has one more day of it before she is done.  Feeling ok off the Lexapro.  Cycles are regular.  No mid cycle spotting.    Patient's last menstrual period was 08/28/2013.          Sexually active: No.  The current method of family planning is OCP (estrogen/progesterone).    Exercising: Yes.    walking Smoker:  no  Health Maintenance: Pap:  05/18/11 WNL/negative HR HPV History of abnormal Pap:  no MMG:  06/24/13 3D-normal Colonoscopy:  2003-normal repeat age 43 BMD:   none TDaP:  Up to date with PCP Screening Labs: PCP, Hb today: PCP, Urine today: PROTEIN-trace   reports that she has never smoked. She has never used smokeless tobacco. She reports that she does not drink alcohol or use illicit drugs.  Past Medical History  Diagnosis Date  . Patient born of twin pregnancy   . IBS (irritable bowel syndrome)   . GERD (gastroesophageal reflux disease)     History reviewed. No pertinent past surgical history.  Current Outpatient Prescriptions  Medication Sig Dispense Refill  . ferrous sulfate 325 (65 FE) MG tablet Take 325 mg by mouth daily with breakfast.      . lansoprazole (PREVACID) 30 MG capsule Take 30 mg by mouth every other day.       . Multiple Vitamin (MULTIVITAMIN WITH MINERALS) TABS tablet Take 1 tablet by mouth daily.      . TRI-PREVIFEM 0.18/0.215/0.25 MG-35 MCG tablet TAKE 1 TABLET DAILY  3 tablet  0  . acetaminophen (TYLENOL) 500 MG tablet Take 500 mg by mouth every 6 (six) hours as needed for moderate pain.      Marland Kitchen. aluminum chloride (DRYSOL) 20 % external solution Apply 1 application topically at bedtime.      Marland Kitchen. escitalopram (LEXAPRO) 10 MG tablet Take 1 tablet (10 mg total) by mouth daily.      . hyoscyamine  (LEVSIN, ANASPAZ) 0.125 MG tablet Take 0.125 mg by mouth every 4 (four) hours as needed for cramping.       No current facility-administered medications for this visit.    Family History  Problem Relation Age of Onset  . Diabetes Paternal Grandmother   . Hypertension Father   . Diabetes Father 80    ROS:  Pertinent items are noted in HPI.  Otherwise, a comprehensive ROS was negative.  Exam:   BP 102/62  Pulse 76  Resp 16  Ht 5' 3.75" (1.619 m)  Wt 156 lb 9.6 oz (71.033 kg)  BMI 27.10 kg/m2  LMP 08/28/2013  Height: 5' 3.75" (161.9 cm)  Ht Readings from Last 3 Encounters:  09/17/13 5' 3.75" (1.619 m)  07/26/13 5\' 3"  (1.6 m)  06/07/12 5' 3.75" (1.619 m)    General appearance: alert, cooperative and appears stated age Head: Normocephalic, without obvious abnormality, atraumatic Neck: no adenopathy, supple, symmetrical, trachea midline and thyroid normal to inspection and palpation Lungs: clear to auscultation bilaterally Breasts: normal appearance, no masses or tenderness Heart: regular rate and rhythm Abdomen: soft, non-tender; bowel sounds normal; no masses,  no organomegaly Extremities: extremities normal, atraumatic, no cyanosis or edema Skin: Skin color, texture, turgor normal. No rashes or lesions Lymph nodes: Cervical,  supraclavicular, and axillary nodes normal. No abnormal inguinal nodes palpated Neurologic: Grossly normal   Pelvic: External genitalia:  no lesions              Urethra:  normal appearing urethra with no masses, tenderness or lesions              Bartholins and Skenes: normal                 Vagina: normal appearing vagina with normal color and discharge, no lesions              Cervix: no lesions              Pap taken: Yes.   Bimanual Exam:  Uterus:  normal size, contour, position, consistency, mobility, non-tender              Adnexa: normal adnexa and no mass, fullness, tenderness               Rectovaginal: Confirms               Anus:   normal sphincter tone, no lesions  A:  Well Woman with normal exam  No SA  On OCPs for BC  H/O elevated TGs and LDLS 10/14  P: Mammogram d/w pt. 3D recommended due to Grade 3 dense breasts  Return for fasting labs--lipids, TSH (which was elevated with hospital admission) Tri-Previfem rx to pharmacy.  #3 mo/4RF Return annually or prn  An After Visit Summary was printed and given to the patient.

## 2013-09-17 NOTE — Patient Instructions (Signed)

## 2013-09-18 LAB — IPS PAP TEST WITH REFLEX TO HPV

## 2013-09-24 ENCOUNTER — Other Ambulatory Visit (INDEPENDENT_AMBULATORY_CARE_PROVIDER_SITE_OTHER): Payer: BC Managed Care – PPO

## 2013-09-24 DIAGNOSIS — Z0000 Encounter for general adult medical examination without abnormal findings: Secondary | ICD-10-CM

## 2013-09-24 LAB — LIPID PANEL
Cholesterol: 201 mg/dL — ABNORMAL HIGH (ref 0–200)
HDL: 44 mg/dL (ref >39)
LDL Cholesterol: 112 mg/dL — ABNORMAL HIGH (ref 0–99)
Total CHOL/HDL Ratio: 4.6 Ratio
Triglycerides: 224 mg/dL — ABNORMAL HIGH (ref <150)
VLDL: 45 mg/dL — ABNORMAL HIGH (ref 0–40)

## 2013-09-24 LAB — TSH: TSH: 2.143 u[IU]/mL (ref 0.350–4.500)

## 2013-11-22 ENCOUNTER — Other Ambulatory Visit: Payer: Self-pay

## 2013-12-09 ENCOUNTER — Encounter: Payer: Self-pay | Admitting: Obstetrics & Gynecology

## 2014-07-28 ENCOUNTER — Other Ambulatory Visit: Payer: Self-pay

## 2014-07-28 DIAGNOSIS — Z1231 Encounter for screening mammogram for malignant neoplasm of breast: Secondary | ICD-10-CM

## 2014-08-27 ENCOUNTER — Ambulatory Visit
Admission: RE | Admit: 2014-08-27 | Discharge: 2014-08-27 | Disposition: A | Discharge status: Home / Self Care | Payer: BC Managed Care – PPO | Source: Ambulatory Visit

## 2014-08-27 DIAGNOSIS — Z1231 Encounter for screening mammogram for malignant neoplasm of breast: Secondary | ICD-10-CM

## 2014-09-17 ENCOUNTER — Other Ambulatory Visit: Payer: Self-pay | Admitting: Obstetrics & Gynecology

## 2014-09-17 NOTE — Telephone Encounter (Signed)
Medication refill request: Tri-Previfem Last AEX:  09/17/13 with SM Next AEX: 10/07/14 with SM Last MMG (if hormonal medication request): 08/28/14 breast density c; bi-rads 1: negative Refill authorized:  #84/0  (routed to Dr. Edward Jolly since Dr. Hyacinth Meeker is out of the office today)

## 2014-10-07 ENCOUNTER — Ambulatory Visit (INDEPENDENT_AMBULATORY_CARE_PROVIDER_SITE_OTHER): Payer: BC Managed Care – PPO | Admitting: Obstetrics & Gynecology

## 2014-10-07 ENCOUNTER — Encounter: Payer: Self-pay | Admitting: Obstetrics & Gynecology

## 2014-10-07 VITALS — BP 106/60 | HR 64 | Resp 16 | Ht 63.5 in | Wt 162.0 lb

## 2014-10-07 DIAGNOSIS — Z01419 Encounter for gynecological examination (general) (routine) without abnormal findings: Secondary | ICD-10-CM | POA: Diagnosis not present

## 2014-10-07 DIAGNOSIS — Z0000 Encounter for general adult medical examination without abnormal findings: Secondary | ICD-10-CM | POA: Diagnosis not present

## 2014-10-07 LAB — POCT URINALYSIS DIPSTICK
BILIRUBIN UA: NEGATIVE
Glucose, UA: NEGATIVE
KETONES UA: NEGATIVE
NITRITE UA: NEGATIVE
RBC UA: NEGATIVE
Urobilinogen, UA: NEGATIVE
pH, UA: 5

## 2014-10-07 NOTE — Progress Notes (Signed)
44 y.o. G0P0000 SingleCaucasianF here for annual exam.  Doing well.  Reports having issues with lightheadedness just like she had last year.  Sodium level was mildly low last time.  Thinks this was due to Lexapro.  Was switched to Prozac.    Cycles are regular.    PCP:  Dr. Sigmund Hazel.    Patient's last menstrual period was 10/01/2014.          Sexually active: No.  The current method of family planning is OCP (estrogen/progesterone).    Exercising: Yes.    walking-2 x weekly Smoker:  no  Health Maintenance: Pap:  09/17/13 WNL, 05/18/11 WNL/negative HR HPV History of abnormal Pap:  no MMG:  08/27/14 3d BiRads 1-negative  Colonoscopy:  2003-normal, repeat age 69 BMD:   none TDaP:  UTD with PCP Screening Labs: PCP, Hb today: PCP, Urine today: WBC-trace, PROTEIN-trace   reports that she has never smoked. She has never used smokeless tobacco. She reports that she does not drink alcohol or use illicit drugs.  Past Medical History  Diagnosis Date  . Patient born of twin pregnancy   . IBS (irritable bowel syndrome)   . GERD (gastroesophageal reflux disease)     No past surgical history on file.  Current Outpatient Prescriptions  Medication Sig Dispense Refill  . acetaminophen (TYLENOL) 500 MG tablet Take 500 mg by mouth every 6 (six) hours as needed for moderate pain.    Marland Kitchen aluminum chloride (DRYSOL) 20 % external solution Apply 1 application topically at bedtime.    . ferrous sulfate 325 (65 FE) MG tablet Take 325 mg by mouth daily with breakfast.    . FLUoxetine (PROZAC) 20 MG capsule     . hyoscyamine (LEVSIN, ANASPAZ) 0.125 MG tablet Take 0.125 mg by mouth every 4 (four) hours as needed for cramping.    . lansoprazole (PREVACID) 30 MG capsule Take 30 mg by mouth every other day.     . Multiple Vitamin (MULTIVITAMIN WITH MINERALS) TABS tablet Take 1 tablet by mouth daily.    . TRI-PREVIFEM 0.18/0.215/0.25 MG-35 MCG tablet TAKE 1 TABLET DAILY 84 tablet 0   No current  facility-administered medications for this visit.    Family History  Problem Relation Age of Onset  . Diabetes Paternal Grandmother   . Hypertension Father   . Diabetes Father 80    ROS:  Pertinent items are noted in HPI.  Otherwise, a comprehensive ROS was negative.  Exam:   BP 106/60 mmHg  Pulse 64  Resp 16  Ht 5' 3.5" (1.613 m)  Wt 162 lb (73.483 kg)  BMI 28.24 kg/m2  LMP 10/01/2014  Weight change: +6#   Height: 5' 3.5" (161.3 cm)  Ht Readings from Last 3 Encounters:  10/07/14 5' 3.5" (1.613 m)  09/17/13 5' 3.75" (1.619 m)  07/26/13  (1.6 m)    General appearance: alert, cooperative and appears stated age Head: Normocephalic, without obvious abnormality, atraumatic Neck: no adenopathy, supple, symmetrical, trachea midline and thyroid normal to inspection and palpation Lungs: clear to auscultation bilaterally Breasts: normal appearance, no masses or tenderness Heart: regular rate and rhythm Abdomen: soft, non-tender; bowel sounds normal; no masses,  no organomegaly Extremities: extremities normal, atraumatic, no cyanosis or edema Skin: Skin color, texture, turgor normal. No rashes or lesions Lymph nodes: Cervical, supraclavicular, and axillary nodes normal. No abnormal inguinal nodes palpated Neurologic: Grossly normal   Pelvic: External genitalia:  no lesions  Urethra:  normal appearing urethra with no masses, tenderness or lesions              Bartholins and Skenes: normal                 Vagina: normal appearing vagina with normal color and discharge, no lesions              Cervix: no lesions              Pap taken: No. Bimanual Exam:  Uterus:  normal size, contour, position, consistency, mobility, non-tender              Adnexa: normal adnexa and no mass, fullness, tenderness               Rectovaginal: Confirms               Anus:  normal sphincter tone, no lesions  Chaperone was present for exam.  A: Well Woman with normal exam   Not SA  On OCPs for cycle control  H/O elevated TGs and LDLS 10/14 Two episodes of hyponatremia in last two years  P: Mammogram d/w pt. Doing 3D due to breast masses. Labs done with Dr. Sigmund Hazel On Tri-Previfem.  No rx needed at this time.  She will call when this is needed.   Return annually or prn

## 2014-12-08 ENCOUNTER — Other Ambulatory Visit: Payer: Self-pay | Admitting: Obstetrics and Gynecology

## 2014-12-08 NOTE — Telephone Encounter (Signed)
Medication refill request: Tri-Previfem  Last AEX:  10/07/14 SM Next AEX: 01/22/16 SM Last MMG (if hormonal medication request): 08/28/14 BIRADS1:neg Refill authorized: 09/17/14 #84tabs/ 0R. today please advise.

## 2015-08-28 ENCOUNTER — Other Ambulatory Visit: Payer: Self-pay | Admitting: Obstetrics & Gynecology

## 2015-08-28 DIAGNOSIS — Z1231 Encounter for screening mammogram for malignant neoplasm of breast: Secondary | ICD-10-CM

## 2015-09-04 ENCOUNTER — Other Ambulatory Visit: Payer: Self-pay | Admitting: Obstetrics & Gynecology

## 2015-09-04 MED ORDER — NORGESTIM-ETH ESTRAD TRIPHASIC 0.18/0.215/0.25 MG-35 MCG PO TABS: 1.0000 | ORAL_TABLET | Freq: Every day | ORAL | 0 refills | Status: DC

## 2015-09-04 NOTE — Telephone Encounter (Signed)
Medication refill request: TRI-PREVIFEM Last AEX:  10/07/14 SM Next AEX: 10/30/15 Last MMG (if hormonal medication request): 08/27/14 BIRADS1 negative Refill authorized: 12/08/14 #84 w/4 refills; today #84 w/0 refills?

## 2015-09-04 NOTE — Telephone Encounter (Signed)
Patient request refills of her birth control to be called to  [facility] pharmacy until her next aex 10/30/15 with Dr.[name].

## 2015-09-18 ENCOUNTER — Ambulatory Visit
Admission: RE | Admit: 2015-09-18 | Discharge: 2015-09-18 | Disposition: A | Discharge status: Home / Self Care | Payer: BC Managed Care – PPO | Source: Ambulatory Visit | Attending: Obstetrics & Gynecology | Admitting: Obstetrics & Gynecology

## 2015-09-18 DIAGNOSIS — Z1231 Encounter for screening mammogram for malignant neoplasm of breast: Secondary | ICD-10-CM

## 2015-10-30 ENCOUNTER — Ambulatory Visit (INDEPENDENT_AMBULATORY_CARE_PROVIDER_SITE_OTHER): Payer: BC Managed Care – PPO | Admitting: Obstetrics & Gynecology

## 2015-10-30 ENCOUNTER — Encounter: Payer: Self-pay | Admitting: Obstetrics & Gynecology

## 2015-10-30 VITALS — BP 100/56 | HR 66 | Resp 14 | Ht 64.5 in | Wt 163.0 lb

## 2015-10-30 DIAGNOSIS — Z01419 Encounter for gynecological examination (general) (routine) without abnormal findings: Secondary | ICD-10-CM | POA: Diagnosis not present

## 2015-10-30 DIAGNOSIS — Z0000 Encounter for general adult medical examination without abnormal findings: Secondary | ICD-10-CM | POA: Diagnosis not present

## 2015-10-30 DIAGNOSIS — Z124 Encounter for screening for malignant neoplasm of cervix: Secondary | ICD-10-CM

## 2015-10-30 LAB — POCT URINALYSIS DIPSTICK
Bilirubin, UA: NEGATIVE
Glucose, UA: NEGATIVE
KETONES UA: NEGATIVE
Leukocytes, UA: NEGATIVE
Nitrite, UA: NEGATIVE
PROTEIN UA: NEGATIVE
Urobilinogen, UA: NEGATIVE
pH, UA: 5

## 2015-10-30 MED ORDER — NORGESTIM-ETH ESTRAD TRIPHASIC 0.18/0.215/0.25 MG-25 MCG PO TABS: 1.0000 | ORAL_TABLET | Freq: Every day | ORAL | 11 refills | Status: DC

## 2015-10-30 NOTE — Progress Notes (Signed)
45 y.o. G0P0000 Single Caucasian F here for annual exam.  Doing well.  [place] to [place]  Cycles are regular.  No spotting.  D/W pt lowing her estrogen dosage in her OCPs due to age and risks.  She is comfortable with this.  Questions answered.  PCP:  Dr. Sigmund HazelLisa [name].  Has done lab work this year.    Patient's last menstrual period was 10/30/2015.          Sexually active: No.  The current method of family planning is OCP (estrogen/progesterone).    Exercising: Yes.    walking  Smoker:  no  Health Maintenance: Pap:  09/17/13 negative  History of abnormal Pap:  no MMG:  09/22/15 BIRADS 1 negative  Colonoscopy:  2003- normal repeat at age 45  BMD:   never TDaP:  Up to date  Pneumonia vaccine(s):  never Zostavax:   never Hep C testing: not indicated  Screening Labs: discuss with provider, Hb today: discuss with provider, Urine today: trace RBC's   reports that she has never smoked. She has never used smokeless tobacco. She reports that she does not drink alcohol or use drugs.  Past Medical History:  Diagnosis Date  . GERD (gastroesophageal reflux disease)   . IBS (irritable bowel syndrome)   . Patient born of twin pregnancy     No past surgical history on file.  Current Outpatient Prescriptions  Medication Sig Dispense Refill  . acetaminophen (TYLENOL) 500 MG tablet Take 500 mg by mouth every 6 (six) hours as needed for moderate pain.    Marland Kitchen. aluminum chloride (DRYSOL) 20 % external solution Apply 1 application topically at bedtime.    . ferrous sulfate 325 (65 FE) MG tablet Take 325 mg by mouth daily with breakfast.    . FLUoxetine (PROZAC) 20 MG capsule     . hyoscyamine (LEVSIN, ANASPAZ) 0.125 MG tablet Take 0.125 mg by mouth every 4 (four) hours as needed for cramping.    . lansoprazole (PREVACID) 30 MG capsule Take 30 mg by mouth every other day.     . Multiple Vitamin (MULTIVITAMIN WITH MINERALS) TABS tablet Take 1 tablet by mouth daily.    . Norgestimate-Ethinyl  Estradiol Triphasic (TRI-PREVIFEM) 0.18/0.215/0.25 MG-35 MCG tablet Take 1 tablet by mouth daily. 84 tablet 0   No current facility-administered medications for this visit.     Family History  Problem Relation Age of Onset  . Diabetes Paternal Grandmother   . Hypertension Father   . Diabetes Father 80    ROS:  Pertinent items are noted in HPI.  Otherwise, a comprehensive ROS was negative.  Exam:   BP (!) 100/56 (BP Location: Right Arm, Patient Position: Sitting, Cuff Size: Normal)   Pulse 66   Resp 14   Ht 5' 4.5" (1.638 m)   Wt 163 lb (73.9 kg)   LMP 10/30/2015   BMI 27.55 kg/m   Weight: -1#  Height: 5' 4.5" (163.8 cm)  Ht Readings from Last 3 Encounters:  10/30/15 5' 4.5" (1.638 m)  10/07/14 5' 3.5" (1.613 m)  09/17/13 5' 3.75" (1.619 m)    General appearance: alert, cooperative and appears stated age Head: Normocephalic, without obvious abnormality, atraumatic Neck: no adenopathy, supple, symmetrical, trachea midline and thyroid normal to inspection and palpation Lungs: clear to auscultation bilaterally Breasts: normal appearance, no masses or tenderness Heart: regular rate and rhythm Abdomen: soft, non-tender; bowel sounds normal; no masses,  no organomegaly Extremities: extremities normal, atraumatic, no cyanosis or edema Skin: Skin color,  texture, turgor normal. No rashes or lesions Lymph nodes: Cervical, supraclavicular, and axillary nodes normal. No abnormal inguinal nodes palpated Neurologic: Grossly normal   Pelvic: External genitalia:  no lesions              Urethra:  normal appearing urethra with no masses, tenderness or lesions              Bartholins and Skenes: normal                 Vagina: normal appearing vagina with normal color and discharge, no lesions              Cervix: no lesions              Pap taken: Yes.   Bimanual Exam:  Uterus:  normal size, contour, position, consistency, mobility, non-tender              Adnexa: normal adnexa and no  mass, fullness, tenderness               Rectovaginal: Confirms               Anus:  normal sphincter tone, no lesions  Chaperone was present for exam.  A:  Well Woman with normal exam  Not SA  On OCPs for cycle control  H/O elevated TGs and LDLS 10/14 Two episodes of hyponatremia in past.  Follow up blood work has been normal.  P: Mammogram d/w pt. Doing 3D due to breast masses. Labs/vaccines done with Dr. Sigmund Hazel Decrease estrogen dosage in OCP to .  Ortho tri cyclen Lo rx to pharmacy.  Will do one month supply for a few months.  Pt may want this switched to 90 day supply.  She will call for this when desired.  Return annually or prn

## 2015-11-06 LAB — IPS PAP TEST WITH HPV

## 2016-01-22 ENCOUNTER — Ambulatory Visit: Payer: BC Managed Care – PPO | Admitting: Obstetrics & Gynecology

## 2016-09-06 ENCOUNTER — Other Ambulatory Visit: Payer: Self-pay | Admitting: Obstetrics & Gynecology

## 2016-09-06 DIAGNOSIS — Z1231 Encounter for screening mammogram for malignant neoplasm of breast: Secondary | ICD-10-CM

## 2016-09-19 ENCOUNTER — Ambulatory Visit
Admission: RE | Admit: 2016-09-19 | Discharge: 2016-09-19 | Disposition: A | Discharge status: Home / Self Care | Payer: BC Managed Care – PPO | Source: Ambulatory Visit | Attending: Obstetrics & Gynecology | Admitting: Obstetrics & Gynecology

## 2016-09-19 DIAGNOSIS — Z1231 Encounter for screening mammogram for malignant neoplasm of breast: Secondary | ICD-10-CM

## 2016-10-29 ENCOUNTER — Other Ambulatory Visit: Payer: Self-pay | Admitting: Obstetrics & Gynecology

## 2016-10-31 ENCOUNTER — Other Ambulatory Visit: Payer: Self-pay | Admitting: Obstetrics & Gynecology

## 2016-10-31 NOTE — Telephone Encounter (Signed)
Medication refill request: OCP  Last AEX:  10-30-15  Next AEX: 02-16-17  Last MMG (if hormonal medication request): 09-20-16 WNL  Refill authorized: please advise

## 2016-11-01 NOTE — Telephone Encounter (Signed)
Medication refill request: OCP  Last AEX:  10-30-15  Next AEX: 02-16-17  Last MMG (if hormonal medication request): 09-20-16 WNL  Refill authorized: please advise  

## 2017-02-16 ENCOUNTER — Ambulatory Visit: Payer: BC Managed Care – PPO | Admitting: Obstetrics & Gynecology

## 2017-03-02 ENCOUNTER — Ambulatory Visit: Payer: BC Managed Care – PPO | Admitting: Obstetrics & Gynecology

## 2017-03-02 ENCOUNTER — Other Ambulatory Visit: Payer: Self-pay

## 2017-03-02 ENCOUNTER — Encounter: Payer: Self-pay | Admitting: Obstetrics & Gynecology

## 2017-03-02 VITALS — BP 106/60 | HR 76 | Resp 14 | Ht 64.0 in | Wt 175.0 lb

## 2017-03-02 DIAGNOSIS — E871 Hypo-osmolality and hyponatremia: Secondary | ICD-10-CM | POA: Diagnosis not present

## 2017-03-02 DIAGNOSIS — Z0000 Encounter for general adult medical examination without abnormal findings: Secondary | ICD-10-CM

## 2017-03-02 DIAGNOSIS — Z01419 Encounter for gynecological examination (general) (routine) without abnormal findings: Secondary | ICD-10-CM | POA: Diagnosis not present

## 2017-03-02 MED ORDER — NORGESTIM-ETH ESTRAD TRIPHASIC 0.18/0.215/0.25 MG-25 MCG PO TABS: 1.0000 | ORAL_TABLET | Freq: Every day | ORAL | 4 refills | Status: DC

## 2017-03-02 NOTE — Progress Notes (Signed)
47 y.o. G0P0000 SingleCaucasianF here for annual exam.  Doing well.  [place] to [place] over [place] to see family.  Cycles still regular.    Patient's last menstrual period was 02/12/2017.          Sexually active: No.  The current method of family planning is OCP (estrogen/progesterone).    Exercising: No.  The patient does not participate in regular exercise at present. Smoker:  no  Health Maintenance: Pap:  10/30/15 Neg. HR HPV:neg   09/17/13 Neg  History of abnormal Pap:  no MMG:  09/20/16 BIRADS1:neg  Colonoscopy: 2003 - f/u age 47.  Discussed with pt new guidelines for screening colonoscopy.   BMD:   Never TDaP:  Current  Pneumonia vaccine(s):  n/a Shingrix:   n/a Hep C testing: n/a Screening Labs: PCP   reports that  has never smoked. she has never used smokeless tobacco. She reports that she does not drink alcohol or use drugs.  Past Medical History:  Diagnosis Date  . GERD (gastroesophageal reflux disease)   . IBS (irritable bowel syndrome)   . Patient born of twin pregnancy     History reviewed. No pertinent surgical history.  Current Outpatient Medications  Medication Sig Dispense Refill  . acetaminophen (TYLENOL) 500 MG tablet Take 500 mg by mouth every 6 (six) hours as needed for moderate pain.    . ferrous sulfate 325 (65 FE) MG tablet Take 325 mg by mouth daily with breakfast.    . FLUoxetine (PROZAC) 20 MG capsule     . hyoscyamine (LEVSIN, ANASPAZ) 0.125 MG tablet Take 0.125 mg by mouth every 4 (four) hours as needed for cramping.    . lansoprazole (PREVACID) 30 MG capsule Take 30 mg by mouth every other day.     . Multiple Vitamin (MULTIVITAMIN WITH MINERALS) TABS tablet Take 1 tablet by mouth daily.    . Norgestimate-Ethinyl Estradiol Triphasic 0.18/0.215/0.25 MG-25 MCG tab Take 1 tablet by mouth daily. 1 Package 11  . TRI-SPRINTEC 0.18/0.215/0.25 MG-35 MCG tablet TAKE 1 TABLET DAILY. 28 tablet 3   No current facility-administered medications for  this visit.     Family History  Problem Relation Age of Onset  . Diabetes Paternal Grandmother   . Hypertension Father   . Diabetes Father 80    ROS:  Pertinent items are noted in HPI.  Otherwise, a comprehensive ROS was negative.  Exam:   BP 106/60 (BP Location: Right Arm, Patient Position: Sitting, Cuff Size: Large)   Pulse 76   Resp 14   Ht 5\' 4"  (1.626 m)   Wt 175 lb (79.4 kg)   LMP 02/12/2017   BMI 30.04 kg/m     Height: 5\' 4"  (162.6 cm)  Ht Readings from Last 3 Encounters:  03/02/17 5\' 4"  (1.626 m)  10/30/15 5' 4.5" (1.638 m)  10/07/14 5' 3.5" (1.613 m)    General appearance: alert, cooperative and appears stated age Head: Normocephalic, without obvious abnormality, atraumatic Neck: no adenopathy, supple, symmetrical, trachea midline and thyroid normal to inspection and palpation Lungs: clear to auscultation bilaterally Breasts: normal appearance, no masses or tenderness Heart: regular rate and rhythm Abdomen: soft, non-tender; bowel sounds normal; no masses,  no organomegaly Extremities: extremities normal, atraumatic, no cyanosis or edema Skin: Skin color, texture, turgor normal. No rashes or lesions Lymph nodes: Cervical, supraclavicular, and axillary nodes normal. No abnormal inguinal nodes palpated Neurologic: Grossly normal   Pelvic: External genitalia:  no lesions  Urethra:  normal appearing urethra with no masses, tenderness or lesions              Bartholins and Skenes: normal                 Vagina: normal appearing vagina with normal color and discharge, no lesions              Cervix: no lesions              Pap taken: No. Bimanual Exam:  Uterus:  normal size, contour, position, consistency, mobility, non-tender              Adnexa: normal adnexa and no mass, fullness, tenderness               Rectovaginal: Confirms               Anus:  normal sphincter tone, no lesions  Chaperone was present for exam.  A:  Well Woman with normal  exam  On OCPs for cycle control H/O mildly elevated lipids  P:   Mammogram guidelines reviewed.  Doing 3D due to breast density TSH, Lipids, Vit D, CMP, cholesterol pending Pap smear and neg HR HPV obtained 9/17.  No pap smear obtained today. RF for Ortho Tricyclen Lo rx to pharmacy.  Pt aware lowing estrogen dosage due to age and risks of DVT/PE.  Knows to call with any concerns/changes. Return annually or prn

## 2017-03-03 LAB — LIPID PANEL
CHOL/HDL RATIO: 5.1 ratio — AB (ref 0.0–4.4)
Cholesterol, Total: 233 mg/dL — ABNORMAL HIGH (ref 100–199)
HDL: 46 mg/dL (ref >39)
LDL Calculated: 132 mg/dL — ABNORMAL HIGH (ref 0–99)
Triglycerides: 277 mg/dL — ABNORMAL HIGH (ref 0–149)
VLDL CHOLESTEROL CAL: 55 mg/dL — AB (ref 5–40)

## 2017-03-03 LAB — COMPREHENSIVE METABOLIC PANEL
ALT: 13 IU/L (ref 0–32)
AST: 12 IU/L (ref 0–40)
Albumin/Globulin Ratio: 1.4 (ref 1.2–2.2)
Albumin: 3.9 g/dL (ref 3.5–5.5)
Alkaline Phosphatase: 67 IU/L (ref 39–117)
BUN/Creatinine Ratio: 27 — ABNORMAL HIGH (ref 9–23)
BUN: 16 mg/dL (ref 6–24)
Bilirubin Total: <0.2 mg/dL (ref 0.0–1.2)
CALCIUM: 9.5 mg/dL (ref 8.7–10.2)
CO2: 25 mmol/L (ref 20–29)
Chloride: 100 mmol/L (ref 96–106)
Creatinine, Ser: 0.6 mg/dL (ref 0.57–1.00)
GFR, EST AFRICAN AMERICAN: 125 mL/min/{1.73_m2} (ref >59)
GFR, EST NON AFRICAN AMERICAN: 108 mL/min/{1.73_m2} (ref >59)
GLUCOSE: 111 mg/dL — AB (ref 65–99)
Globulin, Total: 2.7 g/dL (ref 1.5–4.5)
Potassium: 4.5 mmol/L (ref 3.5–5.2)
Sodium: 138 mmol/L (ref 134–144)
TOTAL PROTEIN: 6.6 g/dL (ref 6.0–8.5)

## 2017-03-03 LAB — CBC
Hematocrit: 35.7 % (ref 34.0–46.6)
Hemoglobin: 11.8 g/dL (ref 11.1–15.9)
MCH: 31.1 pg (ref 26.6–33.0)
MCHC: 33.1 g/dL (ref 31.5–35.7)
MCV: 94 fL (ref 79–97)
PLATELETS: 342 10*3/uL (ref 150–379)
RBC: 3.79 x10E6/uL (ref 3.77–5.28)
RDW: 14.5 % (ref 12.3–15.4)
WBC: 7.6 10*3/uL (ref 3.4–10.8)

## 2017-03-03 LAB — VITAMIN D 25 HYDROXY (VIT D DEFICIENCY, FRACTURES): Vit D, 25-Hydroxy: 42.8 ng/mL (ref 30.0–100.0)

## 2017-03-03 LAB — TSH: TSH: 1.81 u[IU]/mL (ref 0.450–4.500)

## 2017-03-06 ENCOUNTER — Telehealth: Payer: Self-pay | Admitting: *Deleted

## 2017-03-06 NOTE — Telephone Encounter (Signed)
LM for pt to call back.

## 2017-03-06 NOTE — Telephone Encounter (Signed)
-----   Message from Jerene BearsMary S [name], MD sent at 03/05/2017  7:45 AM EST ----- Please let pt know her CBC, TSH and Vit D were normal.  Her glucose was mildly elevated and her lipids were not normal.  Triglycerides were elevated at 277 and total cholesterol was elevated at 233.  Please confirm this was fasting.  If so, I will add on a hbA1c to assess for diabetes and I will want her to see her PCP, Dr. Sigmund HazelLisa [name].  If not fasting, she should only repeat this fasting going forward and should have it repeated next year.

## 2017-03-06 NOTE — Telephone Encounter (Signed)
Patient returned Reina's call.

## 2017-03-07 NOTE — Telephone Encounter (Signed)
Return call to Reina °

## 2017-03-07 NOTE — Telephone Encounter (Signed)
LM for pt to call back. Second attempt  

## 2017-03-09 NOTE — Telephone Encounter (Signed)
Please route to triage if I am not available.

## 2017-03-09 NOTE — Telephone Encounter (Signed)
Left voice mail to call back 

## 2017-03-23 NOTE — Telephone Encounter (Signed)
Notes recorded by Alphonsa Overallixon, Amanda L, CMA on 03/10/2017 at 10:23 AM EST Spoke with patient and notified of lab results. Patient states she had eaten the day of her exam. Advised per Dr.[name]'s note we will repeat next year fasting. Discussed ways to lower cholesterol/triglycerides and also increase exercise

## 2017-09-19 ENCOUNTER — Other Ambulatory Visit: Payer: Self-pay | Admitting: Obstetrics & Gynecology

## 2017-09-19 DIAGNOSIS — Z1231 Encounter for screening mammogram for malignant neoplasm of breast: Secondary | ICD-10-CM

## 2017-10-18 ENCOUNTER — Ambulatory Visit
Admission: RE | Admit: 2017-10-18 | Discharge: 2017-10-18 | Disposition: A | Discharge status: Home / Self Care | Payer: BC Managed Care – PPO | Source: Ambulatory Visit | Attending: Obstetrics & Gynecology | Admitting: Obstetrics & Gynecology

## 2017-10-18 DIAGNOSIS — Z1231 Encounter for screening mammogram for malignant neoplasm of breast: Secondary | ICD-10-CM

## 2018-04-27 ENCOUNTER — Telehealth: Payer: Self-pay | Admitting: Obstetrics & Gynecology

## 2018-04-27 NOTE — Telephone Encounter (Signed)
Left patient a message to call back to reschedule a future appointment that was cancelled by the provider. °

## 2018-05-04 ENCOUNTER — Ambulatory Visit: Payer: BC Managed Care – PPO | Admitting: Obstetrics & Gynecology

## 2018-05-08 ENCOUNTER — Other Ambulatory Visit: Payer: Self-pay | Admitting: *Deleted

## 2018-05-08 MED ORDER — NORGESTIM-ETH ESTRAD TRIPHASIC 0.18/0.215/0.25 MG-25 MCG PO TABS: 1.0000 | ORAL_TABLET | Freq: Every day | ORAL | 0 refills | Status: DC

## 2018-05-08 NOTE — Telephone Encounter (Signed)
Medication refill request: Tri-Lo  Last AEX:  03/02/17 SM Next AEX: none Last MMG (if hormonal medication request): 10/18/17 BIRADS1:neg  Refill authorized: 03/02/17 #3packs/4R. Today #3packs/0R?

## 2018-07-20 ENCOUNTER — Other Ambulatory Visit: Payer: Self-pay | Admitting: Obstetrics and Gynecology

## 2018-07-20 ENCOUNTER — Other Ambulatory Visit: Payer: Self-pay

## 2018-07-20 NOTE — Telephone Encounter (Signed)
Medication refill request: Tri-lo sprintec Last AEX:  03-02-17 SM  Next AEX: detailed message left for patient to call and schedule aex due to cancellation from Covid Last MMG (if hormonal medication request): 10-18-17 BIRADS 1 negative  Refill authorized: 05-08-2018 #3packs, 0RF. Today, please advise.   Medication pended for #28, 0RF as patient needs to return call to schedule aex. Please refill if appropriate.

## 2018-07-24 ENCOUNTER — Other Ambulatory Visit: Payer: Self-pay

## 2018-07-24 ENCOUNTER — Encounter: Payer: Self-pay | Admitting: Obstetrics & Gynecology

## 2018-07-24 ENCOUNTER — Other Ambulatory Visit (HOSPITAL_COMMUNITY)
Admission: RE | Admit: 2018-07-24 | Discharge: 2018-07-24 | Disposition: A | Discharge status: Home / Self Care | Payer: BC Managed Care – PPO | Source: Ambulatory Visit | Attending: Obstetrics & Gynecology | Admitting: Obstetrics & Gynecology

## 2018-07-24 ENCOUNTER — Ambulatory Visit: Payer: BC Managed Care – PPO | Admitting: Obstetrics & Gynecology

## 2018-07-24 VITALS — BP 108/60 | HR 76 | Temp 97.9°F | Ht 63.5 in | Wt 168.0 lb

## 2018-07-24 DIAGNOSIS — Z1211 Encounter for screening for malignant neoplasm of colon: Secondary | ICD-10-CM | POA: Diagnosis not present

## 2018-07-24 DIAGNOSIS — Z01419 Encounter for gynecological examination (general) (routine) without abnormal findings: Secondary | ICD-10-CM

## 2018-07-24 DIAGNOSIS — Z124 Encounter for screening for malignant neoplasm of cervix: Secondary | ICD-10-CM | POA: Insufficient documentation

## 2018-07-24 MED ORDER — NORGESTIM-ETH ESTRAD TRIPHASIC 0.18/0.215/0.25 MG-25 MCG PO TABS: 1.0000 | ORAL_TABLET | Freq: Every day | ORAL | 4 refills | Status: DC

## 2018-07-24 NOTE — Progress Notes (Signed)
48 y.o. G0P0000 Single White or Caucasian female here for annual exam.  Doing well.  Cycles are "regular" and last about five days.  The last day or two is very light.  Doesn't have any heavy days.  Is on OCPs.    Has blood work with Dr. Sigmund HazelLisa Newton in May.  Has mild anemia with Hb 11.8 and is on iron supplementation.  Tolerating this fine.  Just did a stool card to test for blood.    Had episode of breast tenderness earlier this year that has significant improved.  Was associated with some breast engorgement as well.  Did not feel any masses at that time.  Last MMG 9/19.  Patient's last menstrual period was 07/03/2018.          Sexually active: No.  The current method of family planning is OCP (estrogen/progesterone).    Exercising: Yes.    walking Smoker:  no  Health Maintenance: Pap:  10/30/15 Neg. HR HPV:neg   09/17/13 neg  History of abnormal Pap:  no MMG:  10/18/17 BIRADS1:neg  Colonoscopy:  Long ago BMD:   Never  TDaP:  2017 Screening Labs: PCP.  Brought copies of lab work.     reports that she has never smoked. She has never used smokeless tobacco. She reports that she does not drink alcohol or use drugs.  Past Medical History:  Diagnosis Date  . Anemia   . GERD (gastroesophageal reflux disease)   . High cholesterol   . IBS (irritable bowel syndrome)   . Patient born of twin pregnancy     History reviewed. No pertinent surgical history.  Current Outpatient Medications  Medication Sig Dispense Refill  . acetaminophen (TYLENOL) 500 MG tablet Take 500 mg by mouth every 6 (six) hours as needed for moderate pain.    . ferrous sulfate 325 (65 FE) MG tablet Take 325 mg by mouth daily with breakfast.    . FLUoxetine (PROZAC) 20 MG capsule     . hyoscyamine (LEVSIN, ANASPAZ) 0.125 MG tablet Take 0.125 mg by mouth every 4 (four) hours as needed for cramping.    . lansoprazole (PREVACID) 30 MG capsule Take 30 mg by mouth every other day.     . Multiple Vitamin (MULTIVITAMIN WITH  MINERALS) TABS tablet Take 1 tablet by mouth daily.    . TRI-LO-SPRINTEC 0.18/0.215/0.25 MG-25 MCG tab TAKE 1 TABLET DAILY 84 tablet 0   No current facility-administered medications for this visit.     Family History  Problem Relation Age of Onset  . Diabetes Paternal Grandmother   . Hypertension Father   . Diabetes Father [id number]    Review of Systems  Genitourinary:       Right breast pain  All other systems reviewed and are negative.   Exam:   BP 108/60   Pulse 76   Temp 97.9 F (36.6 C) (Temporal)   Ht 5' 3.5" (1.613 m)   Wt 168 lb (76.2 kg)   LMP 07/03/2018   BMI 29.29 kg/m     Height: 5' 3.5" (161.3 cm)  Ht Readings from Last 3 Encounters:  07/24/18 5' 3.5" (1.613 m)  03/02/17 5\' 4"  (1.626 m)  10/30/15 5' 4.5" (1.638 m)    General appearance: alert, cooperative and appears stated age Head: Normocephalic, without obvious abnormality, atraumatic Neck: no adenopathy, supple, symmetrical, trachea midline and thyroid normal to inspection and palpation Lungs: clear to auscultation bilaterally Breasts: normal appearance, no masses or tenderness Heart: regular rate and rhythm Abdomen:  soft, non-tender; bowel sounds normal; no masses,  no organomegaly Extremities: extremities normal, atraumatic, no cyanosis or edema Skin: Skin color, texture, turgor normal. No rashes or lesions Lymph nodes: Cervical, supraclavicular, and axillary nodes normal. No abnormal inguinal nodes palpated Neurologic: Grossly normal   Pelvic: External genitalia:  no lesions              Urethra:  normal appearing urethra with no masses, tenderness or lesions              Bartholins and Skenes: normal                 Vagina: normal appearing vagina with normal color and discharge, no lesions              Cervix: no lesions              Pap taken: Yes.   Bimanual Exam:  Uterus:  normal size, contour, position, consistency, mobility, non-tender              Adnexa: normal adnexa and no mass,  fullness, tenderness               Rectovaginal: Confirms               Anus:  normal sphincter tone, no lesions  Chaperone was present for exam.  A:  Well Woman with normal exam H/o mildly elevated lipids On OCPs for cycle control Mild anemia with hb of 11.8  P:   Mammogram guidelines reviewed pap smear obtained today.  Neg pap and neg HR HPV 9/17 Lab work has been done with Dr. Kathyrn Newton and will be scanned into Epic RF for Ortho Tricyclen Lo rx with mail order pharmacy.  #84/4RF. Pt is going to call if has any new breast pain issues.  Would lower estrogen dose in OCP which I will plan to do next year. Referral for colonoscopy placed today Shingrix vaccination discussed. return annually or prn

## 2018-07-25 LAB — CYTOLOGY - PAP: Diagnosis: NEGATIVE

## 2018-09-03 IMAGING — MG 2D DIGITAL SCREENING BILATERAL MAMMOGRAM WITH CAD AND ADJUNCT TO
4 series · 4 of 8 positions shown · non-contrast
Comparison: Previous exam(s).

CLINICAL DATA: Screening.

EXAM:
2D DIGITAL SCREENING BILATERAL MAMMOGRAM WITH CAD AND ADJUNCT TOMO

[L MLO]
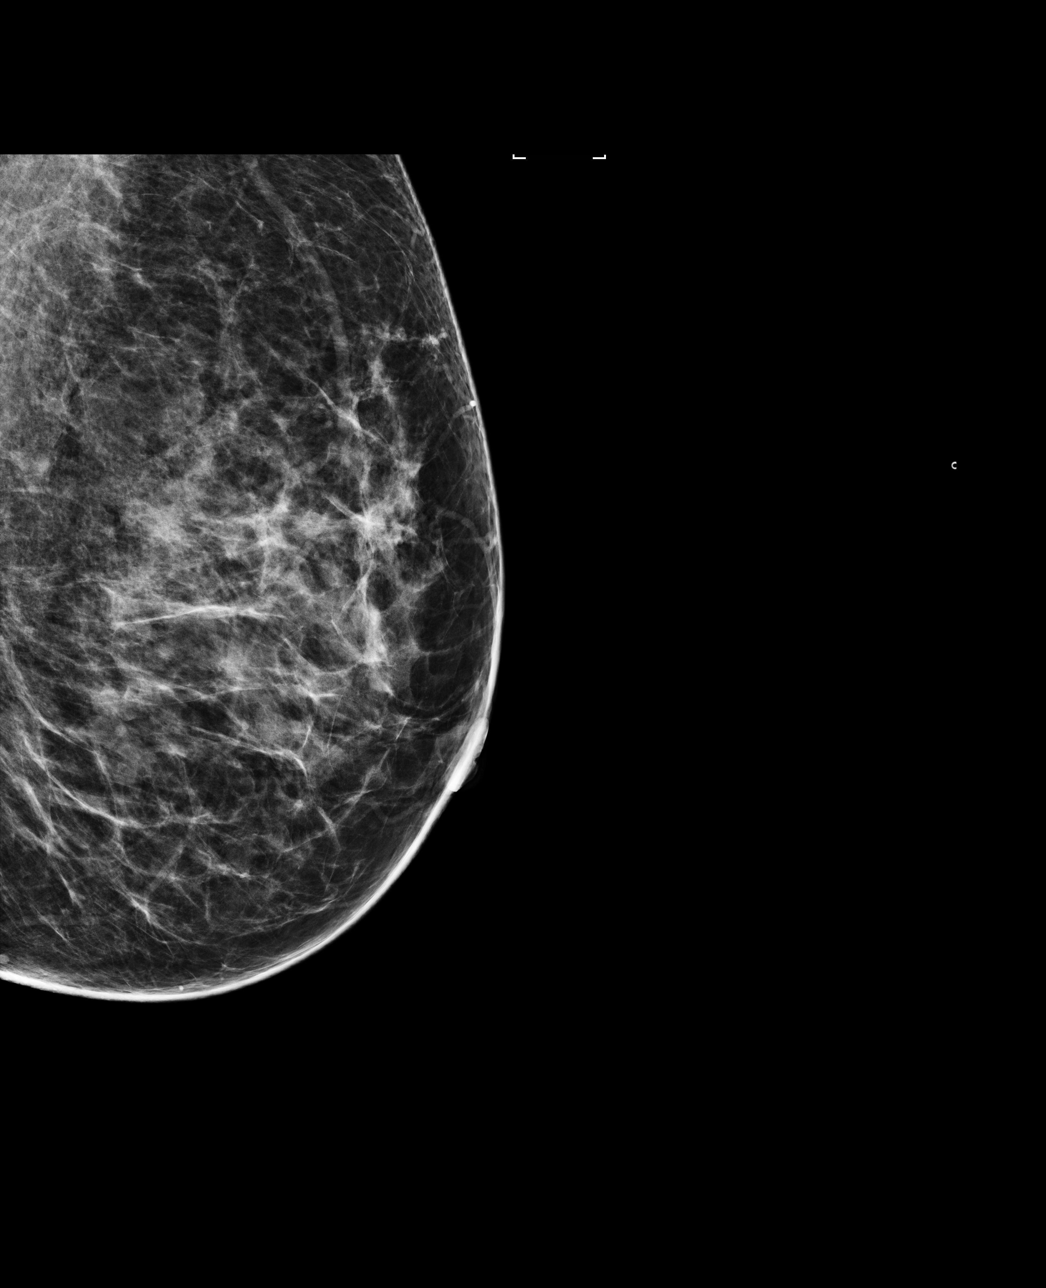

[R CC]
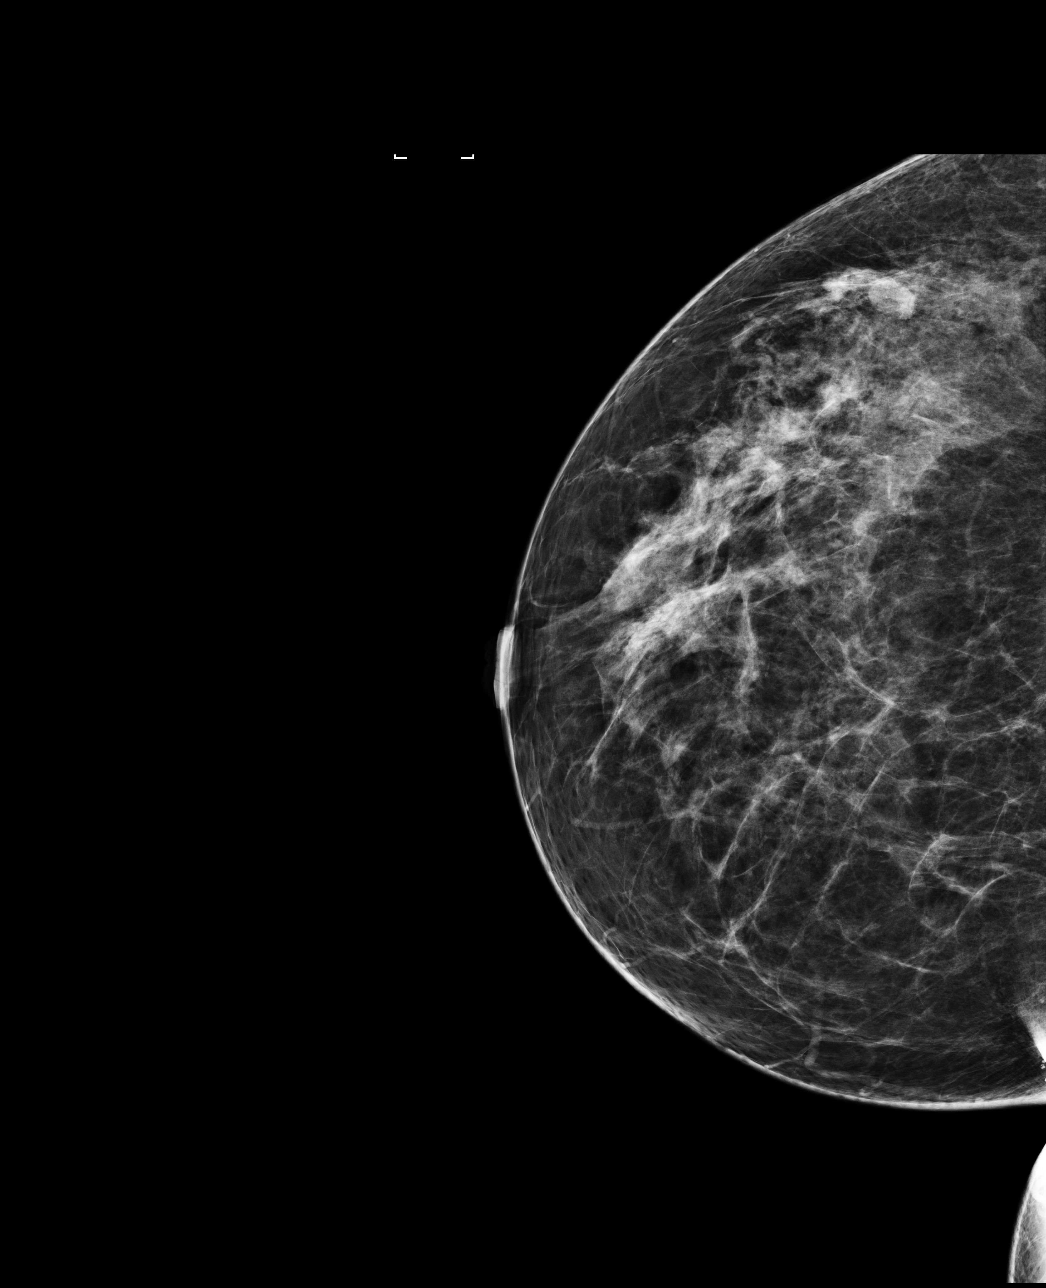

[L CC synth-2D]
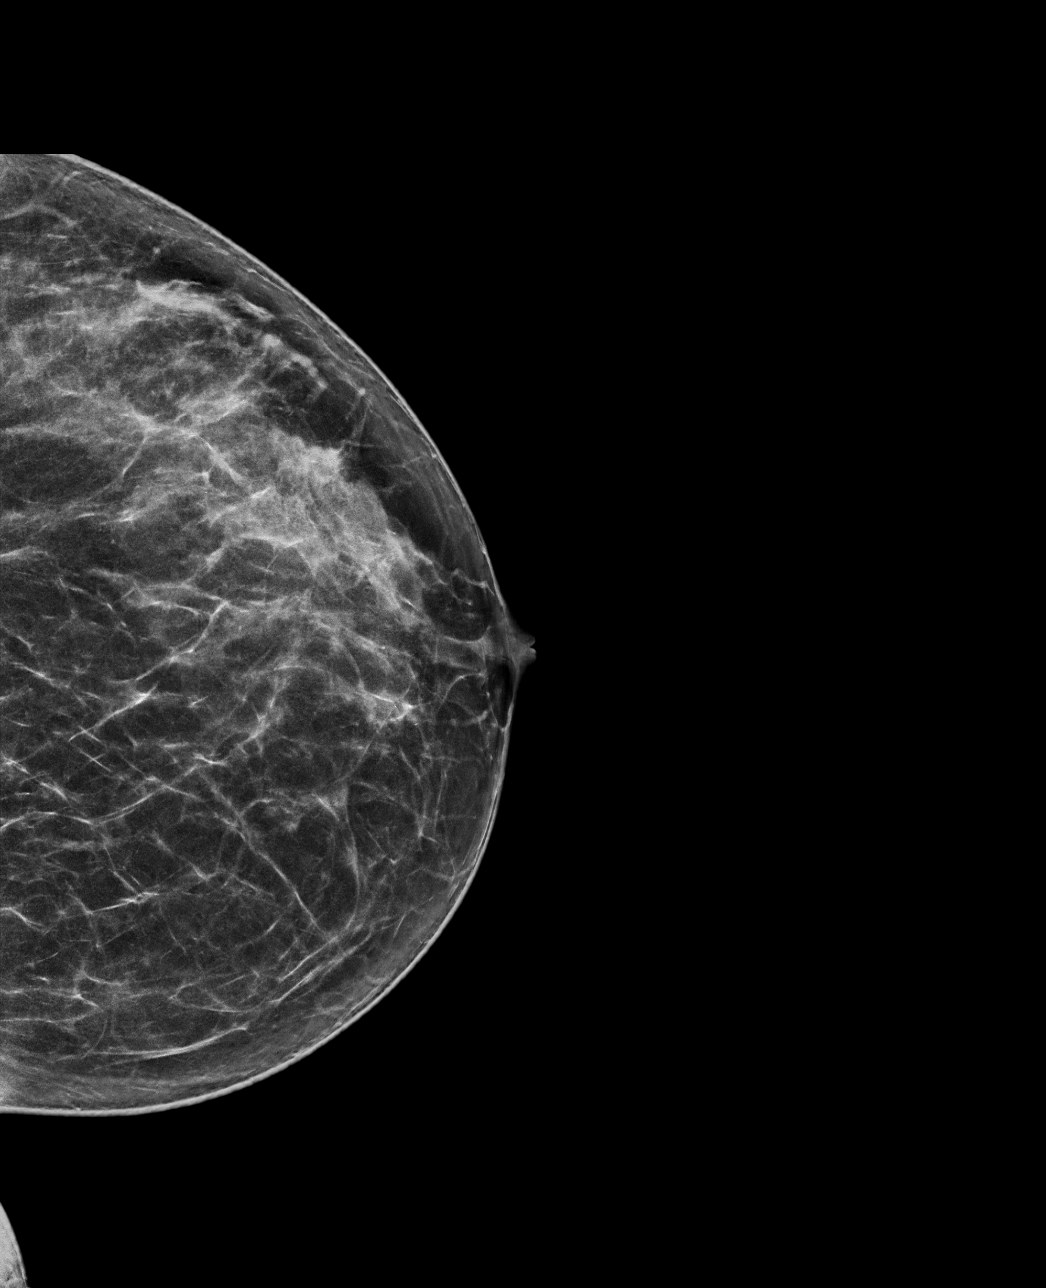

[L MLO tomo · tomo slice 37/73.0]
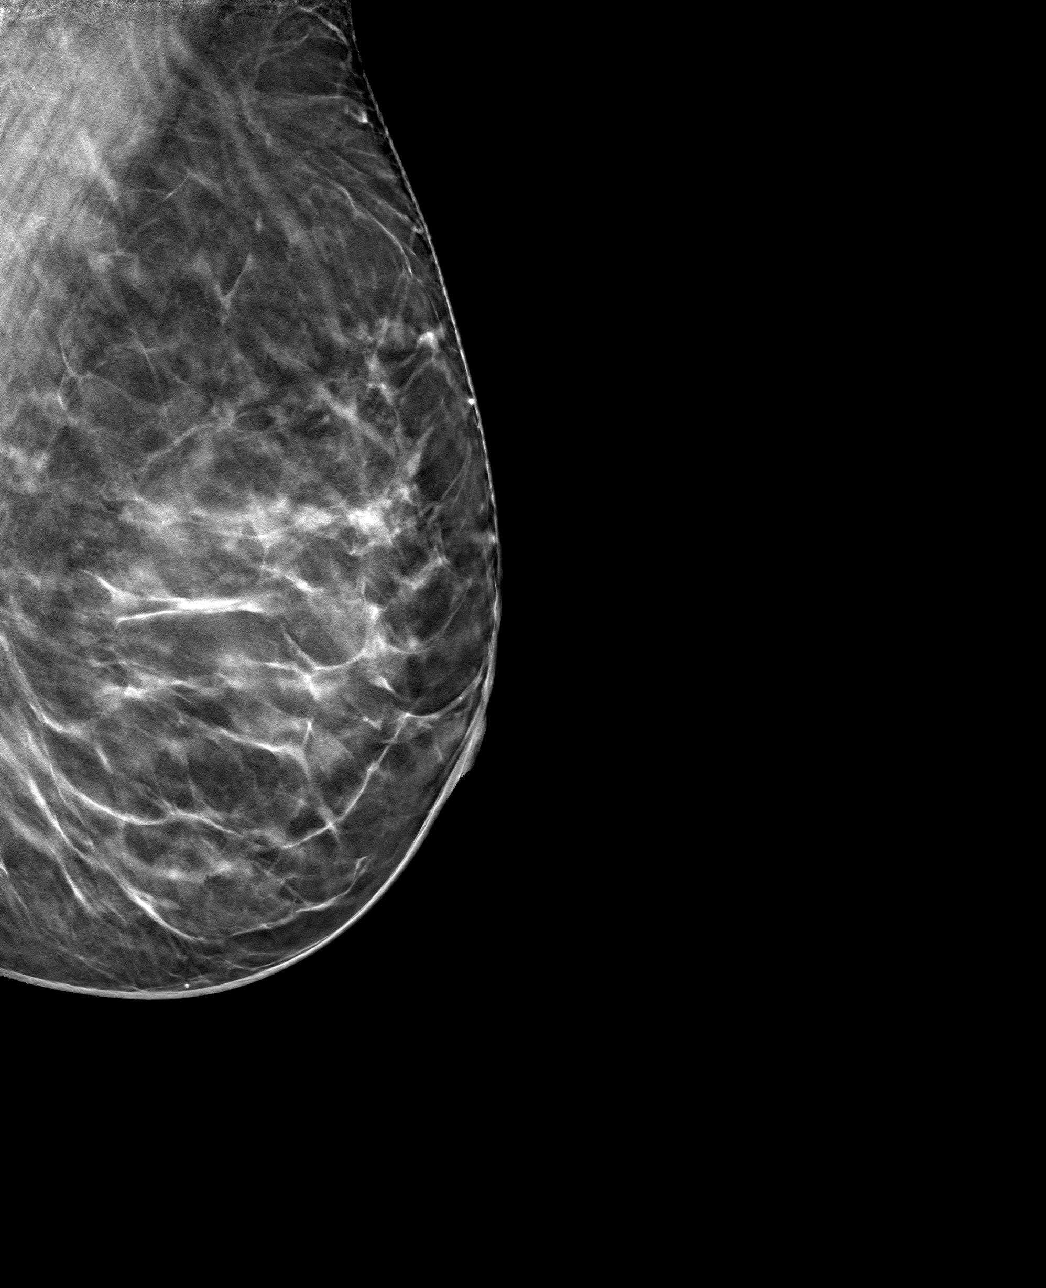

[4 of 8 positions shown; findings below may reference images not displayed]

ACR Breast Density Category c: The breast tissue is heterogeneously
dense, which may obscure small masses.
FINDINGS: There are no findings suspicious for malignancy. Images were
processed with CAD.
IMPRESSION: No mammographic evidence of malignancy. A result letter of this
screening mammogram will be mailed directly to the patient.

RECOMMENDATION:
Screening mammogram in one year. (Code:TN-[id number])

BI-RADS CATEGORY  1: Negative.

## 2018-10-03 ENCOUNTER — Other Ambulatory Visit: Payer: Self-pay | Admitting: Obstetrics & Gynecology

## 2018-10-03 DIAGNOSIS — Z1231 Encounter for screening mammogram for malignant neoplasm of breast: Secondary | ICD-10-CM

## 2018-11-13 ENCOUNTER — Ambulatory Visit
Admission: RE | Admit: 2018-11-13 | Discharge: 2018-11-13 | Disposition: A | Discharge status: Home / Self Care | Payer: BC Managed Care – PPO | Source: Ambulatory Visit | Attending: Obstetrics & Gynecology | Admitting: Obstetrics & Gynecology

## 2018-11-13 ENCOUNTER — Other Ambulatory Visit: Payer: Self-pay

## 2018-11-13 DIAGNOSIS — Z1231 Encounter for screening mammogram for malignant neoplasm of breast: Secondary | ICD-10-CM

## 2019-08-05 ENCOUNTER — Ambulatory Visit: Payer: BC Managed Care – PPO | Admitting: Obstetrics & Gynecology

## 2019-08-23 ENCOUNTER — Other Ambulatory Visit: Payer: Self-pay | Admitting: Obstetrics & Gynecology

## 2019-08-23 NOTE — Telephone Encounter (Signed)
Medication refill request: OCP Last AEX:  07-24-2018 SM  Next AEX: patient to be added to waitlist  Last MMG (if hormonal medication request): 11-13-2018 density C/BIRADS 1 negative  Refill authorized: Today, please advise.   Medication pended #84, 0RF. Please refill if appropriate.

## 2019-11-20 ENCOUNTER — Other Ambulatory Visit: Payer: Self-pay | Admitting: Obstetrics & Gynecology

## 2019-11-20 NOTE — Telephone Encounter (Signed)
Medication refill request: tri-lo-sprintec  Last AEX:  07-24-2018 SM  Next AEX: detailed message left to call office to schedule Last MMG (if hormonal medication request): 11-13-2018 density C/BIRADS 1 negative Refill authorized: Today, please advise.   Medication pended for #28, 0RF. Please refill if appropriate.

## 2019-11-21 NOTE — Telephone Encounter (Signed)
Spoke with pt. Pt states has enough OCP Rx to last til AEX on 11/26/19. Pt aware of refusal and updated recommendations per Dr Hyacinth Meeker. Pt agreeable and verbalized understanding.  Encounter closed.

## 2019-11-21 NOTE — Telephone Encounter (Signed)
Can you please see if pt needs this refilled before 11/26/2019?  I was planning on lowering the dosage of her OCP or changing to POP this year.  If doesn't need RF before next week, I'll just wait until I see her for her appt.  Thanks.

## 2019-11-22 NOTE — Progress Notes (Signed)
49 y.o. G0P0000 Single White or Caucasian female here for annual exam.  Doing well.    Taking fluoxitine every other day due to decreased sodium level.     CBC was normal with follow up but she continues on iron supplement.    Last appt with Dr. Sigmund Hazel was in July.  Had cholesterol level done then as well.    Patient's last menstrual period was 11/06/2019 (exact date).          Sexually active: No.  The current method of family planning is OCP (estrogen/progesterone).    Exercising: Yes.    walking Smoker:  no  Health Maintenance: Pap:  10-30-15 neg HPV HR neg, 07-24-2018 neg History of abnormal Pap:  no MMG:  11-14-2018 category c density birads 1:neg.  Aware this is due.   Colonoscopy:  10-08-18 f/u 27yrs BMD:   none TDaP:  2017 Pneumonia vaccine(s):  Not done Shingrix:   Not done Hep C testing: not done Screening Labs: done in July with Dr. Sigmund Hazel   reports that she has never smoked. She has never used smokeless tobacco. She reports that she does not drink alcohol and does not use drugs.  Past Medical History:  Diagnosis Date  . Anemia   . GERD (gastroesophageal reflux disease)   . High cholesterol   . IBS (irritable bowel syndrome)   . Patient born of twin pregnancy     History reviewed. No pertinent surgical history.  Current Outpatient Medications  Medication Sig Dispense Refill  . acetaminophen (TYLENOL) 500 MG tablet Take 500 mg by mouth every 6 (six) hours as needed for moderate pain.    . ferrous sulfate 325 (65 FE) MG tablet Take 325 mg by mouth daily with breakfast.    . FLUoxetine (PROZAC) 20 MG capsule every other day.     . hyoscyamine (LEVSIN, ANASPAZ) 0.125 MG tablet Take 0.125 mg by mouth every 4 (four) hours as needed for cramping.    . lansoprazole (PREVACID) 30 MG capsule Take 30 mg by mouth. Skip 2 days in between taking    . Multiple Vitamin (MULTIVITAMIN WITH MINERALS) TABS tablet Take 1 tablet by mouth daily.    . norethindrone (MICRONOR)  0.35 MG tablet Take 1 tablet (0.35 mg total) by mouth daily. 84 tablet 4   No current facility-administered medications for this visit.    Family History  Problem Relation Age of Onset  . Diabetes Paternal Grandmother   . Hypertension Father   . Diabetes Father 6    Review of Systems  Constitutional: Negative.   HENT: Negative.   Eyes: Negative.   Respiratory: Negative.   Cardiovascular: Negative.   Gastrointestinal: Negative.   Endocrine: Negative.   Genitourinary: Negative.   Musculoskeletal: Negative.   Skin: Negative.   Allergic/Immunologic: Negative.   Neurological: Negative.   Hematological: Negative.   Psychiatric/Behavioral: Negative.     Exam:   BP 120/70   Pulse 70   Resp 16   Ht 5' 3.25" (1.607 m)   Wt 169 lb (76.7 kg)   LMP 11/06/2019 (Exact Date)   BMI 29.70 kg/m   Height: 5' 3.25" (160.7 cm)  General appearance: alert, cooperative and appears stated age Head: Normocephalic, without obvious abnormality, atraumatic Neck: no adenopathy, supple, symmetrical, trachea midline and thyroid normal to inspection and palpation Lungs: clear to auscultation bilaterally Breasts: normal appearance, no masses or tenderness Heart: regular rate and rhythm Abdomen: soft, non-tender; bowel sounds normal; no masses,  no organomegaly Extremities:  extremities normal, atraumatic, no cyanosis or edema Skin: Skin color, texture, turgor normal. No rashes or lesions Lymph nodes: Cervical, supraclavicular, and axillary nodes normal. No abnormal inguinal nodes palpated Neurologic: Grossly normal   Pelvic: External genitalia:  no lesions              Urethra:  normal appearing urethra with no masses, tenderness or lesions              Bartholins and Skenes: normal                 Vagina: normal appearing vagina with normal color and discharge, no lesions              Cervix: no lesions              Pap taken: Yes.   Bimanual Exam:  Uterus:  normal size, contour, position,  consistency, mobility, non-tender              Adnexa: normal adnexa and no mass, fullness, tenderness               Rectovaginal: Confirms               Anus:  normal sphincter tone, no lesions  Chaperone, Zenovia Jordan, CMA, was present for exam.  A:  Well Woman with normal exam H/o mildly elevate lipids  On OCPs for cycle control H/o mild anemia with hb of 11.8, follow up has been normal  P:   Mammogram guidelines reviewed.  Pt is aware this is due.   pap smear with HR HPV obtained today Will change pills to micronor away from combination OCP.  Micronor 1 po x 1 day.  #84/4RF Lab work done with Dr. Sigmund Hazel Colonoscopy 09/2018, follow up 10 years Vaccines reviewed.  We discussed Covid booster Proofreader) and flu shots today. return annually or prn

## 2019-11-26 ENCOUNTER — Other Ambulatory Visit: Payer: Self-pay

## 2019-11-26 ENCOUNTER — Encounter: Payer: Self-pay | Admitting: Obstetrics & Gynecology

## 2019-11-26 ENCOUNTER — Ambulatory Visit (INDEPENDENT_AMBULATORY_CARE_PROVIDER_SITE_OTHER): Payer: BC Managed Care – PPO | Admitting: Obstetrics & Gynecology

## 2019-11-26 VITALS — BP 120/70 | HR 70 | Resp 16 | Ht 63.25 in | Wt 169.0 lb

## 2019-11-26 DIAGNOSIS — E785 Hyperlipidemia, unspecified: Secondary | ICD-10-CM

## 2019-11-26 DIAGNOSIS — Z862 Personal history of diseases of the blood and blood-forming organs and certain disorders involving the immune mechanism: Secondary | ICD-10-CM | POA: Diagnosis not present

## 2019-11-26 DIAGNOSIS — Z124 Encounter for screening for malignant neoplasm of cervix: Secondary | ICD-10-CM | POA: Diagnosis not present

## 2019-11-26 DIAGNOSIS — Z01419 Encounter for gynecological examination (general) (routine) without abnormal findings: Secondary | ICD-10-CM

## 2019-11-26 MED ORDER — NORETHINDRONE 0.35 MG PO TABS: 1.0000 | ORAL_TABLET | Freq: Every day | ORAL | 4 refills | Status: DC

## 2019-12-03 ENCOUNTER — Other Ambulatory Visit (HOSPITAL_COMMUNITY)
Admission: RE | Admit: 2019-12-03 | Discharge: 2019-12-03 | Disposition: A | Discharge status: Home / Self Care | Payer: BC Managed Care – PPO | Source: Ambulatory Visit | Attending: Obstetrics & Gynecology | Admitting: Obstetrics & Gynecology

## 2019-12-03 DIAGNOSIS — Z124 Encounter for screening for malignant neoplasm of cervix: Secondary | ICD-10-CM | POA: Diagnosis present

## 2019-12-03 NOTE — Addendum Note (Signed)
Addended by: Jerene Bears on: 12/03/2019 04:22 PM   Modules accepted: Orders

## 2019-12-05 LAB — CYTOLOGY - PAP
Comment: NEGATIVE
Diagnosis: NEGATIVE
High risk HPV: NEGATIVE

## 2019-12-06 ENCOUNTER — Other Ambulatory Visit: Payer: Self-pay | Admitting: Obstetrics & Gynecology

## 2019-12-18 ENCOUNTER — Other Ambulatory Visit: Payer: Self-pay | Admitting: Obstetrics & Gynecology

## 2019-12-18 DIAGNOSIS — Z1231 Encounter for screening mammogram for malignant neoplasm of breast: Secondary | ICD-10-CM

## 2019-12-19 ENCOUNTER — Other Ambulatory Visit: Payer: Self-pay

## 2019-12-19 ENCOUNTER — Ambulatory Visit
Admission: RE | Admit: 2019-12-19 | Discharge: 2019-12-19 | Disposition: A | Discharge status: Home / Self Care | Payer: BC Managed Care – PPO | Source: Ambulatory Visit | Attending: Obstetrics & Gynecology | Admitting: Obstetrics & Gynecology

## 2019-12-19 DIAGNOSIS — Z1231 Encounter for screening mammogram for malignant neoplasm of breast: Secondary | ICD-10-CM

## 2019-12-24 ENCOUNTER — Other Ambulatory Visit: Payer: Self-pay | Admitting: Obstetrics & Gynecology

## 2019-12-24 DIAGNOSIS — R928 Other abnormal and inconclusive findings on diagnostic imaging of breast: Secondary | ICD-10-CM

## 2020-01-08 ENCOUNTER — Ambulatory Visit: Payer: BC Managed Care – PPO

## 2020-01-08 ENCOUNTER — Other Ambulatory Visit: Payer: Self-pay

## 2020-01-08 ENCOUNTER — Ambulatory Visit
Admission: RE | Admit: 2020-01-08 | Discharge: 2020-01-08 | Disposition: A | Discharge status: Home / Self Care | Payer: BC Managed Care – PPO | Source: Ambulatory Visit | Attending: Obstetrics & Gynecology | Admitting: Obstetrics & Gynecology

## 2020-01-08 DIAGNOSIS — R928 Other abnormal and inconclusive findings on diagnostic imaging of breast: Secondary | ICD-10-CM

## 2020-02-12 ENCOUNTER — Other Ambulatory Visit: Payer: Self-pay | Admitting: Occupational Medicine

## 2020-02-12 ENCOUNTER — Ambulatory Visit
Admission: RE | Admit: 2020-02-12 | Discharge: 2020-02-12 | Disposition: A | Discharge status: Home / Self Care | Payer: No Typology Code available for payment source | Source: Ambulatory Visit | Attending: Occupational Medicine | Admitting: Occupational Medicine

## 2020-02-12 DIAGNOSIS — S6992XA Unspecified injury of left wrist, hand and finger(s), initial encounter: Secondary | ICD-10-CM

## 2020-10-27 IMAGING — MG MM DIGITAL SCREENING BILAT W/ TOMO W/ CAD
2 series · 2 of 2 positions shown · non-contrast
Comparison: Previous exam(s).

CLINICAL DATA: Screening.

EXAM:
DIGITAL SCREENING BILATERAL MAMMOGRAM WITH TOMO AND CAD

[L MLO synth-2D]
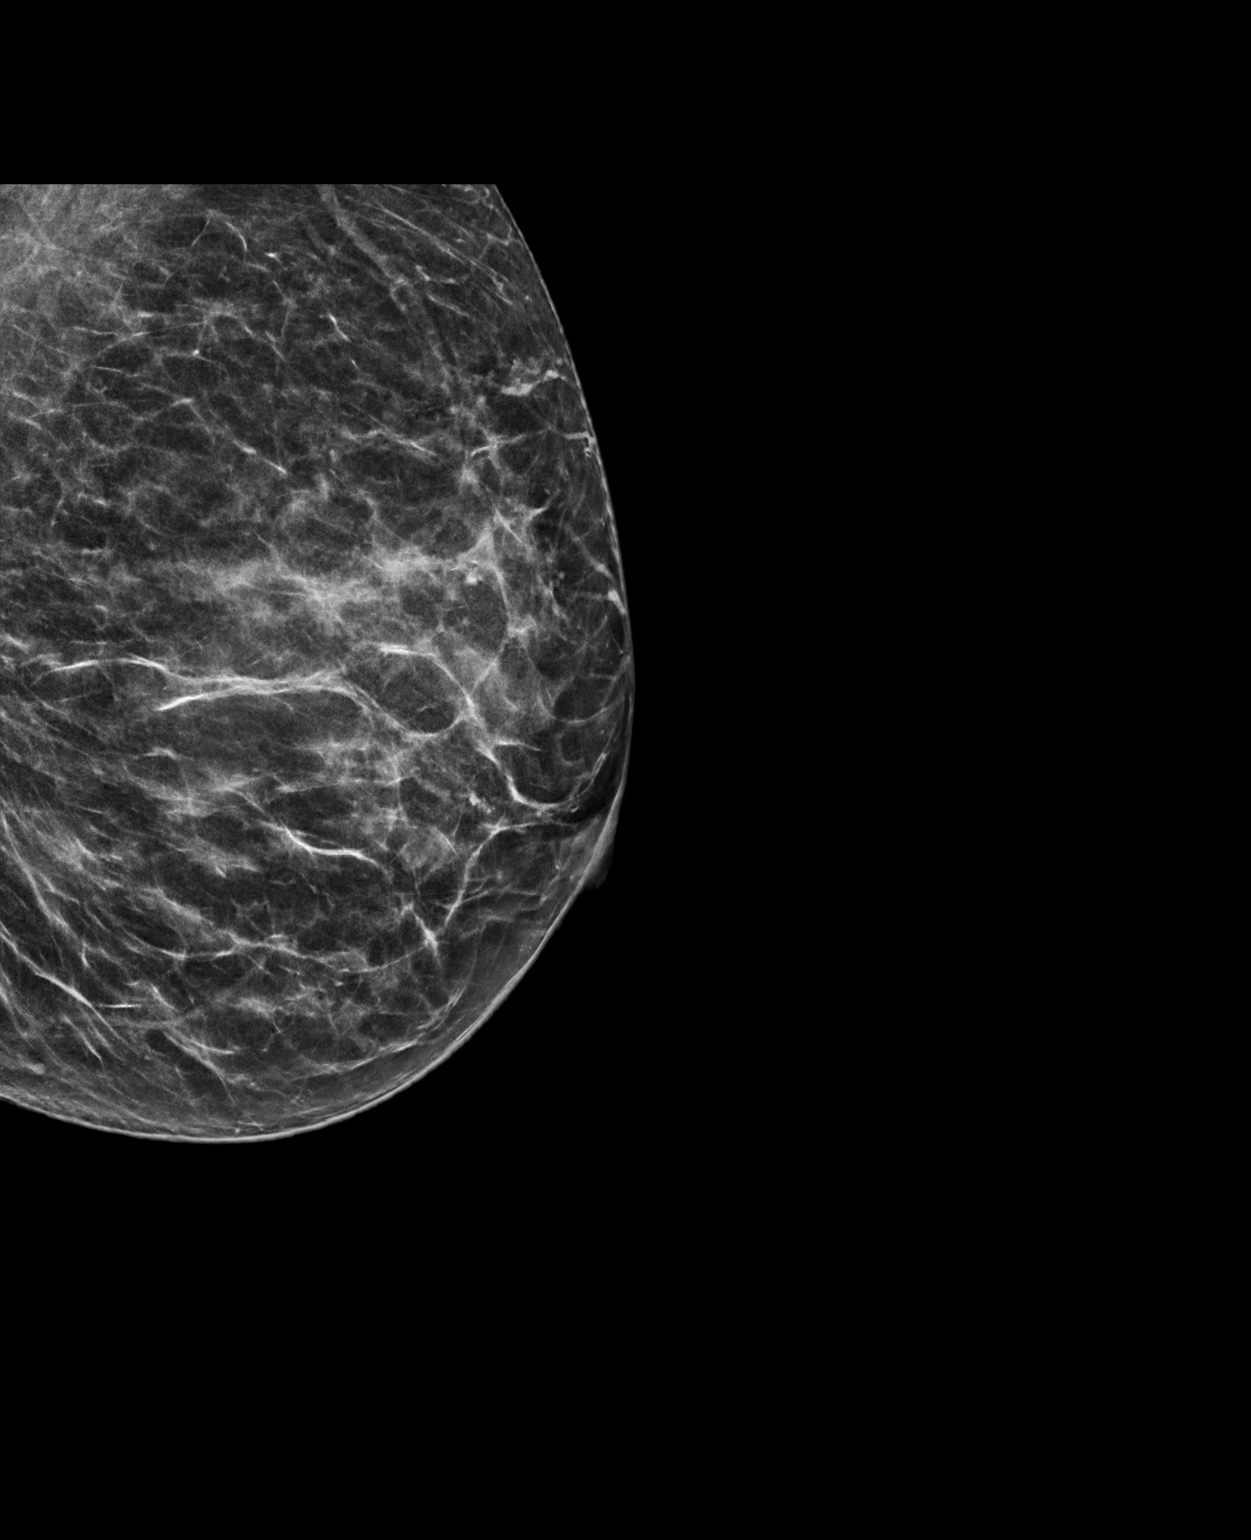

[R MLO synth-2D]
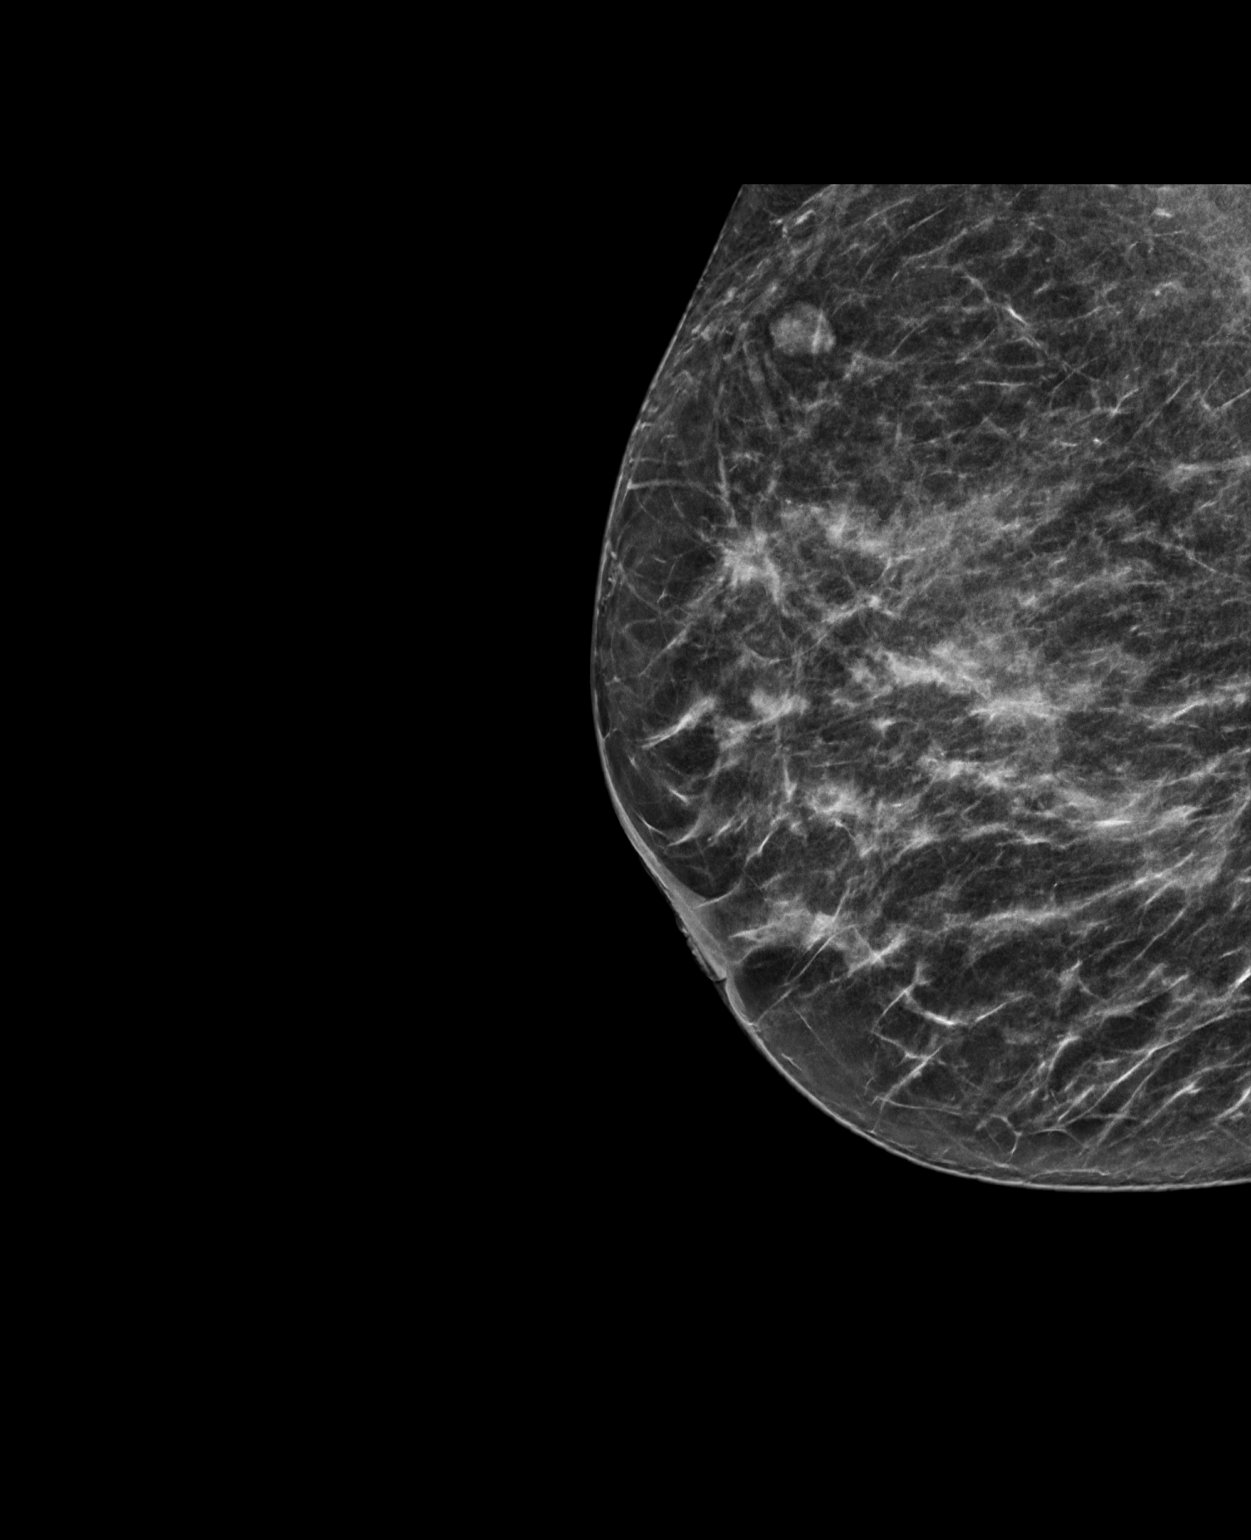

[2 of 2 positions shown; findings below may reference images not displayed]

ACR Breast Density Category c: The breast tissue is heterogeneously
dense, which may obscure small masses.
FINDINGS: There are no findings suspicious for malignancy. Images were
processed with CAD.
IMPRESSION: No mammographic evidence of malignancy. A result letter of this
screening mammogram will be mailed directly to the patient.

RECOMMENDATION:
Screening mammogram in one year. (Code:[id number])

BI-RADS CATEGORY  1: Negative.

## 2020-12-30 ENCOUNTER — Other Ambulatory Visit: Payer: Self-pay | Admitting: Obstetrics & Gynecology

## 2020-12-30 DIAGNOSIS — Z1231 Encounter for screening mammogram for malignant neoplasm of breast: Secondary | ICD-10-CM

## 2021-02-09 ENCOUNTER — Ambulatory Visit
Admission: RE | Admit: 2021-02-09 | Discharge: 2021-02-09 | Disposition: A | Discharge status: Home / Self Care | Payer: BC Managed Care – PPO | Source: Ambulatory Visit | Attending: Obstetrics & Gynecology | Admitting: Obstetrics & Gynecology

## 2021-02-09 ENCOUNTER — Other Ambulatory Visit: Payer: Self-pay

## 2021-02-09 DIAGNOSIS — Z1231 Encounter for screening mammogram for malignant neoplasm of breast: Secondary | ICD-10-CM

## 2021-02-22 ENCOUNTER — Encounter (HOSPITAL_BASED_OUTPATIENT_CLINIC_OR_DEPARTMENT_OTHER): Payer: Self-pay | Admitting: Obstetrics & Gynecology

## 2021-02-22 ENCOUNTER — Ambulatory Visit (INDEPENDENT_AMBULATORY_CARE_PROVIDER_SITE_OTHER): Payer: BC Managed Care – PPO | Admitting: Obstetrics & Gynecology

## 2021-02-22 ENCOUNTER — Other Ambulatory Visit (HOSPITAL_BASED_OUTPATIENT_CLINIC_OR_DEPARTMENT_OTHER): Payer: Self-pay

## 2021-02-22 ENCOUNTER — Other Ambulatory Visit: Payer: Self-pay

## 2021-02-22 VITALS — BP 111/58 | HR 73 | Ht 63.0 in | Wt 165.0 lb

## 2021-02-22 DIAGNOSIS — Z01419 Encounter for gynecological examination (general) (routine) without abnormal findings: Secondary | ICD-10-CM

## 2021-02-22 DIAGNOSIS — E785 Hyperlipidemia, unspecified: Secondary | ICD-10-CM

## 2021-02-22 DIAGNOSIS — Z862 Personal history of diseases of the blood and blood-forming organs and certain disorders involving the immune mechanism: Secondary | ICD-10-CM | POA: Diagnosis not present

## 2021-02-22 DIAGNOSIS — N912 Amenorrhea, unspecified: Secondary | ICD-10-CM

## 2021-02-22 MED ORDER — ZOSTER VAC RECOMB ADJUVANTED 50 MCG/0.5ML IM SUSR
INTRAMUSCULAR | 0 refills | Status: DC
  Filled 2021-02-22: qty 0.5, 1d supply, fill #0

## 2021-02-22 NOTE — Progress Notes (Signed)
51 y.o. G0P0000 Single White or Caucasian female here for annual exam.  Denies vaginal bleeding.  Ran out of micronor.  Wonders if should stop.  Weill check an [facility] today.  No LMP recorded.          Sexually active: No.  The current method of family planning is oral progesterone-only contraceptive.    Exercising: Yes.     walking Smoker:  no  Health Maintenance: Pap:  12/03/2019 Negative History of abnormal Pap:  no MMG:  02/09/2021 Negative Colonoscopy:  10/08/2018 BMD:   Guidelines reviewed Screening Labs: did blood work with Dr Sigmund Hazel in November.  Reports HDLs are low.  She is also prediabetic.     reports that she has never smoked. She has never used smokeless tobacco. She reports that she does not drink alcohol and does not use drugs.  Past Medical History:  Diagnosis Date   Anemia    GERD (gastroesophageal reflux disease)    High cholesterol    IBS (irritable bowel syndrome)    Patient born of twin pregnancy     No past surgical history on file.  Current Outpatient Medications  Medication Sig Dispense Refill   acetaminophen (TYLENOL) 500 MG tablet Take 500 mg by mouth every 6 (six) hours as needed for moderate pain.     ferrous sulfate 325 (65 FE) MG tablet Take 325 mg by mouth daily with breakfast.     FLUoxetine (PROZAC) 20 MG capsule every other day.      hyoscyamine (LEVSIN, ANASPAZ) 0.125 MG tablet Take 0.125 mg by mouth every 4 (four) hours as needed for cramping.     lansoprazole (PREVACID) 30 MG capsule Take 30 mg by mouth. Skip 2 days in between taking     Multiple Vitamin (MULTIVITAMIN WITH MINERALS) TABS tablet Take 1 tablet by mouth daily.     norethindrone (MICRONOR) 0.35 MG tablet Take 1 tablet (0.35 mg total) by mouth daily. 84 tablet 4   No current facility-administered medications for this visit.    Family History  Problem Relation Age of Onset   Diabetes Paternal Grandmother    Hypertension Father    Diabetes Father 51    Review of  Systems  Exam:   BP (!) 111/58 (BP Location: Right Arm, Patient Position: Sitting, Cuff Size: Large)    Pulse 73    Ht 5\' 3"  (1.6 m) Comment: reported   Wt 165 lb (74.8 kg)    BMI 29.23 kg/m   Height: 5\' 3"  (160 cm) (reported)  General appearance: alert, cooperative and appears stated age Head: Normocephalic, without obvious abnormality, atraumatic Neck: no adenopathy, supple, symmetrical, trachea midline and thyroid normal to inspection and palpation Lungs: clear to auscultation bilaterally Breasts: normal appearance, no masses or tenderness Heart: regular rate and rhythm Abdomen: soft, non-tender; bowel sounds normal; no masses,  no organomegaly Extremities: extremities normal, atraumatic, no cyanosis or edema Skin: Skin color, texture, turgor normal. No rashes or lesions Lymph nodes: Cervical, supraclavicular, and axillary nodes normal. No abnormal inguinal nodes palpated Neurologic: Grossly normal   Pelvic: External genitalia:  no lesions              Urethra:  normal appearing urethra with no masses, tenderness or lesions              Bartholins and Skenes: normal                 Vagina: normal appearing vagina with normal color and no discharge, no  lesions              Cervix: no lesions              Pap taken: No. Bimanual Exam:  Uterus:  normal size, contour, position, consistency, mobility, non-tender              Adnexa: normal adnexa and no mass, fullness, tenderness               Rectovaginal: Confirms               Anus:  normal sphincter tone, no lesions  Chaperone, Ina Homes, CMA, was present for exam.  Assessment/Plan: 1. Well woman exam with routine gynecological exam - pap neg with neg HR HPV 2021.  Plan to repeat in 2024. - MMG just completed and was normal. - colonoscopy up to date - plan BMD close to age 82 - lab work done with Dr. Sigmund Hazel - vaccines reviewed.  Pt has not completed Shingrix but will.  Information given.  2. Amenorrhea - will  check FSH today.  If elevated will stop micronor.  Rx not completed today. - Follicle stimulating hormone  3. History of anemia  4. Elevated lipids

## 2021-02-23 LAB — FOLLICLE STIMULATING HORMONE: FSH: 2.6 m[IU]/mL

## 2021-03-01 ENCOUNTER — Other Ambulatory Visit (HOSPITAL_BASED_OUTPATIENT_CLINIC_OR_DEPARTMENT_OTHER): Payer: Self-pay | Admitting: Obstetrics & Gynecology

## 2021-03-08 ENCOUNTER — Other Ambulatory Visit (HOSPITAL_BASED_OUTPATIENT_CLINIC_OR_DEPARTMENT_OTHER): Payer: Self-pay | Admitting: Obstetrics & Gynecology

## 2021-03-08 MED ORDER — NORETHINDRONE 0.35 MG PO TABS: 1.0000 | ORAL_TABLET | Freq: Every day | ORAL | 4 refills | Status: DC

## 2021-08-16 ENCOUNTER — Other Ambulatory Visit: Payer: Self-pay | Admitting: Family Medicine

## 2021-08-16 ENCOUNTER — Ambulatory Visit
Admission: RE | Admit: 2021-08-16 | Discharge: 2021-08-16 | Disposition: A | Discharge status: Home / Self Care | Payer: BC Managed Care – PPO | Source: Ambulatory Visit | Attending: Family Medicine | Admitting: Family Medicine

## 2021-08-16 DIAGNOSIS — R0781 Pleurodynia: Secondary | ICD-10-CM

## 2021-12-10 ENCOUNTER — Other Ambulatory Visit: Payer: Self-pay | Admitting: Family Medicine

## 2021-12-10 ENCOUNTER — Other Ambulatory Visit: Payer: Self-pay | Admitting: Sports Medicine

## 2021-12-10 ENCOUNTER — Ambulatory Visit
Admission: RE | Admit: 2021-12-10 | Discharge: 2021-12-10 | Disposition: A | Discharge status: Home / Self Care | Payer: BC Managed Care – PPO | Source: Ambulatory Visit | Attending: Sports Medicine | Admitting: Sports Medicine

## 2021-12-10 DIAGNOSIS — N644 Mastodynia: Secondary | ICD-10-CM

## 2021-12-10 DIAGNOSIS — M546 Pain in thoracic spine: Secondary | ICD-10-CM

## 2021-12-10 DIAGNOSIS — M545 Low back pain, unspecified: Secondary | ICD-10-CM

## 2021-12-14 ENCOUNTER — Other Ambulatory Visit: Payer: Self-pay | Admitting: Sports Medicine

## 2021-12-14 DIAGNOSIS — M545 Low back pain, unspecified: Secondary | ICD-10-CM

## 2021-12-26 ENCOUNTER — Ambulatory Visit
Admission: RE | Admit: 2021-12-26 | Discharge: 2021-12-26 | Disposition: A | Discharge status: Home / Self Care | Payer: BC Managed Care – PPO | Source: Ambulatory Visit | Attending: Sports Medicine | Admitting: Sports Medicine

## 2021-12-26 DIAGNOSIS — M545 Low back pain, unspecified: Secondary | ICD-10-CM

## 2022-01-11 ENCOUNTER — Ambulatory Visit
Admission: RE | Admit: 2022-01-11 | Discharge: 2022-01-11 | Disposition: A | Discharge status: Home / Self Care | Payer: BC Managed Care – PPO | Source: Ambulatory Visit | Attending: Family Medicine | Admitting: Family Medicine

## 2022-01-11 ENCOUNTER — Other Ambulatory Visit: Payer: Self-pay | Admitting: Family Medicine

## 2022-01-11 DIAGNOSIS — N644 Mastodynia: Secondary | ICD-10-CM

## 2022-01-11 DIAGNOSIS — N6310 Unspecified lump in the right breast, unspecified quadrant: Secondary | ICD-10-CM

## 2022-01-14 ENCOUNTER — Ambulatory Visit
Admission: RE | Admit: 2022-01-14 | Discharge: 2022-01-14 | Disposition: A | Discharge status: Home / Self Care | Payer: BC Managed Care – PPO | Source: Ambulatory Visit | Attending: Family Medicine | Admitting: Family Medicine

## 2022-01-14 DIAGNOSIS — N6310 Unspecified lump in the right breast, unspecified quadrant: Secondary | ICD-10-CM

## 2022-01-14 HISTORY — PX: BREAST BIOPSY: SHX20

## 2022-02-02 ENCOUNTER — Other Ambulatory Visit (HOSPITAL_BASED_OUTPATIENT_CLINIC_OR_DEPARTMENT_OTHER): Payer: Self-pay | Admitting: *Deleted

## 2022-02-02 MED ORDER — NORETHINDRONE 0.35 MG PO TABS: 1.0000 | ORAL_TABLET | Freq: Every day | ORAL | 0 refills | Status: DC

## 2022-02-23 ENCOUNTER — Encounter (HOSPITAL_BASED_OUTPATIENT_CLINIC_OR_DEPARTMENT_OTHER): Payer: Self-pay | Admitting: Obstetrics & Gynecology

## 2022-02-23 ENCOUNTER — Ambulatory Visit (INDEPENDENT_AMBULATORY_CARE_PROVIDER_SITE_OTHER): Payer: BC Managed Care – PPO | Admitting: Obstetrics & Gynecology

## 2022-02-23 VITALS — BP 113/74 | HR 67 | Ht 63.0 in | Wt 173.2 lb

## 2022-02-23 DIAGNOSIS — N6453 Retraction of nipple: Secondary | ICD-10-CM

## 2022-02-23 DIAGNOSIS — E785 Hyperlipidemia, unspecified: Secondary | ICD-10-CM | POA: Diagnosis not present

## 2022-02-23 DIAGNOSIS — Z01419 Encounter for gynecological examination (general) (routine) without abnormal findings: Secondary | ICD-10-CM | POA: Diagnosis not present

## 2022-02-23 DIAGNOSIS — N912 Amenorrhea, unspecified: Secondary | ICD-10-CM

## 2022-02-23 MED ORDER — NORETHINDRONE 0.35 MG PO TABS: 1.0000 | ORAL_TABLET | Freq: Every day | ORAL | 3 refills | Status: DC

## 2022-02-23 NOTE — Progress Notes (Signed)
52 y.o. G0P0000 Single White or Caucasian female here for annual exam.  Doing well.  Has noted new right nipple retraction.  She shared this with her PCP, Kathyrn Lass.  Had diagnostic MMG and then a biopsy that showed benign findings.  Breast MRI has been recommended.  Reports some communication issues with PCP's office.  Will order breast MRI for her today and have precert done.    No LMP recorded. (Menstrual status: Oral contraceptives).          Sexually active: No.  The current method of family planning is post menopausal status.    Exercising: a little bit Smoker:  no  Health Maintenance: Pap:  11/2019 History of abnormal Pap:  no MMG:  01/11/2022 with follow up imaging and biopsy Colonoscopy:  10/08/2018 follow up 10 years BMD:   not indicated at this time Screening Labs: done with Dr. Kathyrn Lass   reports that she has never smoked. She has never used smokeless tobacco. She reports that she does not drink alcohol and does not use drugs.  Past Medical History:  Diagnosis Date   Anemia    GERD (gastroesophageal reflux disease)    High cholesterol    IBS (irritable bowel syndrome)    Patient born of twin pregnancy    Prediabetes     Past Surgical History:  Procedure Laterality Date   BREAST BIOPSY Right 01/14/2022   [place] RT BREAST BX W LOC DEV 1ST LESION IMG BX SPEC US GUIDE 01/14/2022 [facility]    Current Outpatient Medications  Medication Sig Dispense Refill   acetaminophen (TYLENOL) 500 MG tablet Take 500 mg by mouth every 6 (six) hours as needed for moderate pain.     ferrous sulfate 325 (65 FE) MG tablet Take 325 mg by mouth daily with breakfast.     FLUoxetine (PROZAC) 20 MG capsule every other day.      lansoprazole (PREVACID) 30 MG capsule Take 30 mg by mouth. Skip 2 days in between taking     Multiple Vitamin (MULTIVITAMIN WITH MINERALS) TABS tablet Take 1 tablet by mouth daily.     norethindrone (MICRONOR) 0.35 MG tablet Take 1 tablet (0.35 mg total) by mouth  daily. 84 tablet 0   hyoscyamine (LEVSIN, ANASPAZ) 0.125 MG tablet Take 0.125 mg by mouth every 4 (four) hours as needed for cramping. (Patient not taking: Reported on 02/23/2022)     No current facility-administered medications for this visit.    Family History  Problem Relation Age of Onset   Diabetes Paternal Grandmother    Hypertension Father    Diabetes Father 74    ROS: Constitutional: negative Genitourinary:negative  Exam:   BP 113/74   Pulse 67   Ht 5\' 3"  (1.6 m)   Wt 173 lb 3.2 oz (78.6 kg)   BMI 30.68 kg/m   Height: 5\' 3"  (160 cm)  General appearance: alert, cooperative and appears stated age Head: Normocephalic, without obvious abnormality, atraumatic Neck: no adenopathy, supple, symmetrical, trachea midline and thyroid normal to inspection and palpation Lungs: clear to auscultation bilaterally Breasts: normal appearance, no masses or tenderness Heart: regular rate and rhythm Abdomen: soft, non-tender; bowel sounds normal; no masses,  no organomegaly Extremities: extremities normal, atraumatic, no cyanosis or edema Skin: Skin color, texture, turgor normal. No rashes or lesions Lymph nodes: Cervical, supraclavicular, and axillary nodes normal. No abnormal inguinal nodes palpated Neurologic: Grossly normal   Pelvic: External genitalia:  no lesions  Urethra:  normal appearing urethra with no masses, tenderness or lesions              Bartholins and Skenes: normal                 Vagina: normal appearing vagina with normal color and no discharge, no lesions              Cervix: no lesions              Pap taken: No. Bimanual Exam:  Uterus:  normal size, contour, position, consistency, mobility, non-tender              Adnexa: normal adnexa and no mass, fullness, tenderness               Rectovaginal: Confirms               Anus:  normal sphincter tone, no lesions  Chaperone, Ezekiel Ina, RN, was present for exam.  Assessment/Plan: 1. Well woman  exam with routine gynecological exam - Pap smear neg with neg 2021.  Will repeat next year. - Mammogram 01/11/2022 with subsequent biopsy - Colonoscopy 2020, follow up 5 years - lab work done with PCP, Dr. Kathyrn Lass - vaccines reviewed/updated  2. Abnormal retraction of right nipple - MR BREAST BILATERAL W WO CONTRAST INC CAD; Future  3. Amenorrhea - norethindrone (MICRONOR) 0.35 MG tablet; Take 1 tablet (0.35 mg total) by mouth daily.  Dispense: 84 tablet; Refill: 3  4. Elevated lipids - followed by Dr. Kathyrn Lass

## 2022-03-20 ENCOUNTER — Inpatient Hospital Stay: Admission: RE | Admit: 2022-03-20 | Payer: BC Managed Care – PPO | Source: Ambulatory Visit

## 2022-03-20 ENCOUNTER — Other Ambulatory Visit: Payer: BC Managed Care – PPO

## 2022-04-10 ENCOUNTER — Ambulatory Visit
Admission: RE | Admit: 2022-04-10 | Discharge: 2022-04-10 | Disposition: A | Discharge status: Home / Self Care | Payer: BC Managed Care – PPO | Source: Ambulatory Visit | Attending: Obstetrics & Gynecology | Admitting: Obstetrics & Gynecology

## 2022-04-10 DIAGNOSIS — N6453 Retraction of nipple: Secondary | ICD-10-CM

## 2022-04-10 MED ORDER — GADOPICLENOL 0.5 MMOL/ML IV SOLN
7.5000 mL | Freq: Once | INTRAVENOUS | Status: AC | PRN
  Administered 2022-04-10: 7.5 mL via INTRAVENOUS

## 2022-04-22 ENCOUNTER — Other Ambulatory Visit (HOSPITAL_BASED_OUTPATIENT_CLINIC_OR_DEPARTMENT_OTHER): Payer: Self-pay | Admitting: Obstetrics & Gynecology

## 2022-04-22 DIAGNOSIS — N6453 Retraction of nipple: Secondary | ICD-10-CM

## 2022-05-30 ENCOUNTER — Other Ambulatory Visit: Payer: Self-pay | Admitting: Surgery

## 2023-03-02 ENCOUNTER — Other Ambulatory Visit (HOSPITAL_COMMUNITY)
Admission: RE | Admit: 2023-03-02 | Discharge: 2023-03-02 | Disposition: A | Discharge status: Home / Self Care | Payer: 59 | Source: Ambulatory Visit | Attending: Obstetrics & Gynecology | Admitting: Obstetrics & Gynecology

## 2023-03-02 ENCOUNTER — Ambulatory Visit (INDEPENDENT_AMBULATORY_CARE_PROVIDER_SITE_OTHER): Payer: 59 | Admitting: Obstetrics & Gynecology

## 2023-03-02 ENCOUNTER — Encounter (HOSPITAL_BASED_OUTPATIENT_CLINIC_OR_DEPARTMENT_OTHER): Payer: Self-pay | Admitting: Obstetrics & Gynecology

## 2023-03-02 VITALS — BP 114/71 | HR 75 | Ht 63.0 in | Wt 185.6 lb

## 2023-03-02 DIAGNOSIS — Z1231 Encounter for screening mammogram for malignant neoplasm of breast: Secondary | ICD-10-CM

## 2023-03-02 DIAGNOSIS — E785 Hyperlipidemia, unspecified: Secondary | ICD-10-CM

## 2023-03-02 DIAGNOSIS — Z01419 Encounter for gynecological examination (general) (routine) without abnormal findings: Secondary | ICD-10-CM

## 2023-03-02 DIAGNOSIS — Z124 Encounter for screening for malignant neoplasm of cervix: Secondary | ICD-10-CM | POA: Diagnosis present

## 2023-03-02 DIAGNOSIS — N912 Amenorrhea, unspecified: Secondary | ICD-10-CM

## 2023-03-02 DIAGNOSIS — E871 Hypo-osmolality and hyponatremia: Secondary | ICD-10-CM

## 2023-03-02 NOTE — Progress Notes (Signed)
53 y.o. G0P0000 Single White or Caucasian female here for annual exam.  Doing well.  Denies vaginal bleeding.  On micronor.  Does blood work with Dr. Sigmund Hazel.  Had blood work this past year. Cholesterol was still elevated some.  No on any medication for this right now.  Had new nipple retraction last year that was new.  MMG and MRI was normal.  Saw breast surgeon, Dr. Magnus Ivan, who felt conservative management was reasonable given negative evaluation.  Follow up in December was recommended.  She doesn't really feel she needs follow up if he is not going to do anything else other than monitor.  No new changes, no nipple discharge.  MMG is due so will be ordered.     No LMP recorded. (Menstrual status: Oral contraceptives).           Health Maintenance: Pap:  2021 History of abnormal Pap:  no MMG:  01/11/2022.  Has also done breast MRI.   Colonoscopy:  2020, follow up 10 years BMD:   guidelines reviewed Screening Labs: does with Dr. Sigmund Hazel   reports that she has never smoked. She has never used smokeless tobacco. She reports that she does not drink alcohol and does not use drugs.  Past Medical History:  Diagnosis Date   GERD (gastroesophageal reflux disease)    High cholesterol    History of anemia    IBS (irritable bowel syndrome)    Patient born of twin pregnancy    Prediabetes     Past Surgical History:  Procedure Laterality Date   BREAST BIOPSY Right 01/14/2022   [place] RT BREAST BX W LOC DEV 1ST LESION IMG BX SPEC US GUIDE 01/14/2022 [facility]    Current Outpatient Medications  Medication Sig Dispense Refill   acetaminophen (TYLENOL) 500 MG tablet Take 500 mg by mouth every 6 (six) hours as needed for moderate pain.     ferrous sulfate 325 (65 FE) MG tablet Take 325 mg by mouth daily with breakfast.     FLUoxetine (PROZAC) 20 MG capsule every other day.      hyoscyamine (LEVSIN, ANASPAZ) 0.125 MG tablet Take 0.125 mg by mouth every 4 (four) hours as needed for  cramping.     lansoprazole (PREVACID) 30 MG capsule Take 30 mg by mouth. Skip 2 days in between taking     Multiple Vitamin (MULTIVITAMIN WITH MINERALS) TABS tablet Take 1 tablet by mouth daily.     norethindrone (MICRONOR) 0.35 MG tablet Take 1 tablet (0.35 mg total) by mouth daily. 84 tablet 3   No current facility-administered medications for this visit.    Family History  Problem Relation Age of Onset   Diabetes Paternal Grandmother    Hypertension Father    Diabetes Father 75    ROS: Constitutional: negative Genitourinary:negative  Exam:   BP 114/71 (BP Location: Left Arm, Patient Position: Sitting, Cuff Size: Normal)   Pulse 75   Ht 5\' 3"  (1.6 m)   Wt 185 lb 9.6 oz (84.2 kg)   BMI 32.88 kg/m   Height: 5\' 3"  (160 cm)  General appearance: alert, cooperative and appears stated age Head: Normocephalic, without obvious abnormality, atraumatic Neck: no adenopathy, supple, symmetrical, trachea midline and thyroid normal to inspection and palpation Lungs: clear to auscultation bilaterally Breasts: normal appearance, no masses or tenderness, inverted nipples bilaterally Heart: regular rate and rhythm Abdomen: soft, non-tender; bowel sounds normal; no masses,  no organomegaly Extremities: extremities normal, atraumatic, no cyanosis or edema Skin: Skin  color, texture, turgor normal. No rashes or lesions Lymph nodes: Cervical, supraclavicular, and axillary nodes normal. No abnormal inguinal nodes palpated Neurologic: Grossly normal   Pelvic: External genitalia:  no lesions              Urethra:  normal appearing urethra with no masses, tenderness or lesions              Bartholins and Skenes: normal                 Vagina: normal appearing vagina with normal color and no discharge, no lesions              Cervix: no lesions              Pap taken: Yes.   Bimanual Exam:  Uterus:  normal size, contour, position, consistency, mobility, non-tender              Adnexa: normal  adnexa and no mass, fullness, tenderness               Rectovaginal: Confirms               Anus:  normal sphincter tone, no lesions  Chaperone, Ina Homes, CMA, was present for exam.  Assessment/Plan: 1. Well woman exam with routine gynecological exam (Primary) - Pap smear 2021 so updated today - Mammogram 01/11/2022, diagnostic bilateral, then MRI 04/10/2022 - Colonoscopy 2020, follow up 10 eyars - Bone mineral density not indicated yet - lab work done with PCP, Dr. Sigmund Hazel - vaccines reviewed/updated  2. Encounter for screening mammogram for malignant neoplasm of breast - mammogram is due.  Discussed with pt today.  Order placed.  - MM 3D SCREENING MAMMOGRAM BILATERAL BREAST; Future  3. Cervical cancer screening - Cytology - PAP( [place])  4. Amenorrhea - Follicle stimulating hormone.  If low, will continue micronor for another year  5. Elevated lipids - followed by Dr. Sigmund Hazel

## 2023-03-03 ENCOUNTER — Encounter (HOSPITAL_BASED_OUTPATIENT_CLINIC_OR_DEPARTMENT_OTHER): Payer: Self-pay | Admitting: Obstetrics & Gynecology

## 2023-03-03 LAB — FOLLICLE STIMULATING HORMONE: FSH: 1.5 m[IU]/mL

## 2023-03-05 DIAGNOSIS — E871 Hypo-osmolality and hyponatremia: Secondary | ICD-10-CM | POA: Insufficient documentation

## 2023-03-05 MED ORDER — NORETHINDRONE 0.35 MG PO TABS: 1.0000 | ORAL_TABLET | Freq: Every day | ORAL | 3 refills | Status: DC

## 2023-03-08 LAB — CYTOLOGY - PAP
Comment: NEGATIVE
Diagnosis: NEGATIVE
High risk HPV: NEGATIVE

## 2023-03-16 ENCOUNTER — Other Ambulatory Visit (HOSPITAL_BASED_OUTPATIENT_CLINIC_OR_DEPARTMENT_OTHER): Payer: Self-pay | Admitting: Obstetrics & Gynecology

## 2023-03-16 DIAGNOSIS — N912 Amenorrhea, unspecified: Secondary | ICD-10-CM

## 2023-03-21 ENCOUNTER — Encounter (HOSPITAL_BASED_OUTPATIENT_CLINIC_OR_DEPARTMENT_OTHER): Payer: Self-pay | Admitting: Radiology

## 2023-03-21 ENCOUNTER — Ambulatory Visit (HOSPITAL_BASED_OUTPATIENT_CLINIC_OR_DEPARTMENT_OTHER)
Admission: RE | Admit: 2023-03-21 | Discharge: 2023-03-21 | Disposition: A | Discharge status: Home / Self Care | Payer: 59 | Source: Ambulatory Visit | Attending: Obstetrics & Gynecology | Admitting: Obstetrics & Gynecology

## 2023-03-21 DIAGNOSIS — Z1231 Encounter for screening mammogram for malignant neoplasm of breast: Secondary | ICD-10-CM | POA: Insufficient documentation

## 2024-02-08 ENCOUNTER — Other Ambulatory Visit (HOSPITAL_BASED_OUTPATIENT_CLINIC_OR_DEPARTMENT_OTHER): Payer: Self-pay | Admitting: Certified Nurse Midwife

## 2024-02-08 DIAGNOSIS — N912 Amenorrhea, unspecified: Secondary | ICD-10-CM

## 2024-03-12 ENCOUNTER — Ambulatory Visit (HOSPITAL_BASED_OUTPATIENT_CLINIC_OR_DEPARTMENT_OTHER): Payer: Self-pay | Admitting: Obstetrics & Gynecology

## 2024-05-22 ENCOUNTER — Ambulatory Visit (HOSPITAL_BASED_OUTPATIENT_CLINIC_OR_DEPARTMENT_OTHER): Admitting: Obstetrics & Gynecology
